# Patient Record
Sex: Female | Born: 1945 | Race: White | Hispanic: No | Marital: Single | Smoking: Never smoker
Health system: Southern US, Community
[De-identification: ages and names within clinical notes are randomized; demographics above are authoritative.]

## PROBLEM LIST (undated history)

## (undated) DIAGNOSIS — Z9109 Other allergy status, other than to drugs and biological substances: Secondary | ICD-10-CM

## (undated) HISTORY — PX: APPENDECTOMY: SHX54

---

## 2017-03-05 NOTE — Discharge Instructions (Signed)

## 2017-03-09 ENCOUNTER — Encounter
Admission: RE | Disposition: A | Discharge status: Home / Self Care | Payer: Self-pay | Source: Ambulatory Visit | Attending: Ophthalmology

## 2017-03-09 ENCOUNTER — Ambulatory Visit: Payer: Medicare Other | Admitting: Anesthesiology

## 2017-03-09 ENCOUNTER — Ambulatory Visit
Admission: RE | Admit: 2017-03-09 | Discharge: 2017-03-09 | Disposition: A | Discharge status: Home / Self Care | Payer: Medicare Other | Source: Ambulatory Visit | Attending: Ophthalmology | Admitting: Ophthalmology

## 2017-03-09 DIAGNOSIS — Z791 Long term (current) use of non-steroidal anti-inflammatories (NSAID): Secondary | ICD-10-CM | POA: Insufficient documentation

## 2017-03-09 DIAGNOSIS — Z79899 Other long term (current) drug therapy: Secondary | ICD-10-CM | POA: Insufficient documentation

## 2017-03-09 DIAGNOSIS — H2511 Age-related nuclear cataract, right eye: Secondary | ICD-10-CM | POA: Insufficient documentation

## 2017-03-09 HISTORY — DX: Other allergy status, other than to drugs and biological substances: Z91.09

## 2017-03-09 HISTORY — PX: CATARACT EXTRACTION W/PHACO: SHX586

## 2017-03-09 SURGERY — PHACOEMULSIFICATION, CATARACT, WITH IOL INSERTION
Anesthesia: Monitor Anesthesia Care | Laterality: Right | Wound class: Clean

## 2017-03-09 MED ORDER — MOXIFLOXACIN HCL 0.5 % OP SOLN
OPHTHALMIC | Status: DC | PRN
  Administered 2017-03-09: 0.2 mL via OPHTHALMIC

## 2017-03-09 MED ORDER — OXYCODONE HCL 5 MG/5ML PO SOLN: 5.0000 mg | Freq: Once | ORAL | Status: DC | PRN

## 2017-03-09 MED ORDER — LIDOCAINE HCL (PF) 2 % IJ SOLN
INTRAOCULAR | Status: DC | PRN
  Administered 2017-03-09: 1 mL via INTRAOCULAR

## 2017-03-09 MED ORDER — FENTANYL CITRATE (PF) 100 MCG/2ML IJ SOLN
INTRAMUSCULAR | Status: DC | PRN
  Administered 2017-03-09 (×2): 50 ug via INTRAVENOUS

## 2017-03-09 MED ORDER — ARMC OPHTHALMIC DILATING DROPS
1.0000 "application " | OPHTHALMIC | Status: DC | PRN
  Administered 2017-03-09 (×3): 1 via OPHTHALMIC

## 2017-03-09 MED ORDER — EPINEPHRINE PF 1 MG/ML IJ SOLN
INTRAMUSCULAR | Status: DC | PRN
  Administered 2017-03-09: 84 mL via OPHTHALMIC

## 2017-03-09 MED ORDER — MIDAZOLAM HCL 2 MG/2ML IJ SOLN
INTRAMUSCULAR | Status: DC | PRN
  Administered 2017-03-09: 2 mg via INTRAVENOUS

## 2017-03-09 MED ORDER — SODIUM HYALURONATE 10 MG/ML IO SOLN
INTRAOCULAR | Status: DC | PRN
  Administered 2017-03-09: 0.55 mL via INTRAOCULAR

## 2017-03-09 MED ORDER — OXYCODONE HCL 5 MG PO TABS: 5.0000 mg | ORAL_TABLET | Freq: Once | ORAL | Status: DC | PRN

## 2017-03-09 MED ORDER — SODIUM HYALURONATE 23 MG/ML IO SOLN
INTRAOCULAR | Status: DC | PRN
  Administered 2017-03-09: 0.6 mL via INTRAOCULAR

## 2017-03-09 MED ORDER — LACTATED RINGERS IV SOLN: INTRAVENOUS | Status: DC

## 2017-03-09 SURGICAL SUPPLY — 16 items
CANNULA ANT/CHMB 27GA (MISCELLANEOUS) ×3 IMPLANT
DISSECTOR HYDRO NUCLEUS 50X22 (MISCELLANEOUS) ×3 IMPLANT
GLOVE BIO SURGEON STRL SZ8 (GLOVE) ×3 IMPLANT
GLOVE SURG LX 7.5 STRW (GLOVE) ×2
GLOVE SURG LX STRL 7.5 STRW (GLOVE) ×1 IMPLANT
GOWN STRL REUS W/ TWL LRG LVL3 (GOWN DISPOSABLE) ×2 IMPLANT
GOWN STRL REUS W/TWL LRG LVL3 (GOWN DISPOSABLE) ×4
LENS IOL TECNIS ITEC 8.5 (Intraocular Lens) ×3 IMPLANT
MARKER SKIN DUAL TIP RULER LAB (MISCELLANEOUS) ×3 IMPLANT
PACK CATARACT (MISCELLANEOUS) ×3 IMPLANT
PACK DR. KING ARMS (PACKS) ×3 IMPLANT
PACK EYE AFTER SURG (MISCELLANEOUS) ×3 IMPLANT
SYR 3ML LL SCALE MARK (SYRINGE) ×3 IMPLANT
SYR TB 1ML LUER SLIP (SYRINGE) ×3 IMPLANT
WATER STERILE IRR 500ML POUR (IV SOLUTION) ×3 IMPLANT
WIPE NON LINTING 3.25X3.25 (MISCELLANEOUS) ×3 IMPLANT

## 2017-03-09 NOTE — Anesthesia Preprocedure Evaluation (Signed)
Anesthesia Evaluation  Patient identified by MRN, date of birth, ID band Patient awake    Airway Mallampati: II  TM Distance: >3 FB Neck ROM: full    Dental no notable dental hx.    Pulmonary neg pulmonary ROS,    Pulmonary exam normal        Cardiovascular negative cardio ROS Normal cardiovascular exam     Neuro/Psych negative neurological ROS  negative psych ROS   GI/Hepatic negative GI ROS, Neg liver ROS,   Endo/Other  negative endocrine ROS  Renal/GU negative Renal ROS  negative genitourinary   Musculoskeletal   Abdominal   Peds  Hematology negative hematology ROS (+)   Anesthesia Other Findings   Reproductive/Obstetrics                             Anesthesia Physical Anesthesia Plan  ASA: II  Anesthesia Plan: MAC   Post-op Pain Management:    Induction:   PONV Risk Score and Plan:   Airway Management Planned:   Additional Equipment:   Intra-op Plan:   Post-operative Plan:   Informed Consent: I have reviewed the patients History and Physical, chart, labs and discussed the procedure including the risks, benefits and alternatives for the proposed anesthesia with the patient or authorized representative who has indicated his/her understanding and acceptance.     Plan Discussed with:   Anesthesia Plan Comments:         Anesthesia Quick Evaluation

## 2017-03-09 NOTE — H&P (Signed)
The History and Physical notes are on paper, have been signed, and are to be scanned.   I have examined the patient and there are no changes to the H&P.   Willey BladeBradley King 03/09/2017 9:35 AM

## 2017-03-09 NOTE — Op Note (Signed)
OPERATIVE NOTE  Nicole QualiaBarbara Jane Murillo [id number] 03/09/2017   PREOPERATIVE DIAGNOSIS:  Nuclear sclerotic cataract right eye.  H25.11   POSTOPERATIVE DIAGNOSIS:    Nuclear sclerotic cataract right eye.     PROCEDURE:  Phacoemusification with posterior chamber intraocular lens placement of the right eye   LENS:   Implant Name Type Inv. Item Serial No. Manufacturer Lot No. LRB No. Used  LENS IOL DIOP 08.5 - [id number] Intraocular Lens LENS IOL DIOP 08.5 [id number]   Right 1       PCB00 +8.5   ULTRASOUND TIME: 0 minutes 34 seconds.  CDE 2.92   SURGEON:  Willey BladeBradley King, MD, MPH  ANESTHESIOLOGIST: Anesthesiologist: Jolayne Pantherunkle, Richard, MD CRNA: Maryan RuedWilson, Jennifer M, CRNA   ANESTHESIA:  Topical with tetracaine drops augmented with 1% preservative-free intracameral lidocaine.  ESTIMATED BLOOD LOSS: less than 1 mL.   COMPLICATIONS:  None.   DESCRIPTION OF PROCEDURE:  The patient was identified in the holding room and transported to the operating room and placed in the supine position under the operating microscope.  The right eye was identified as the operative eye and it was prepped and draped in the usual sterile ophthalmic fashion.   A 1.0 millimeter clear-corneal paracentesis was made at the 10:30 position. 0.5 ml of preservative-free 1% lidocaine with epinephrine was injected into the anterior chamber.  The anterior chamber was filled with Healon 5 viscoelastic.  A 2.4 millimeter keratome was used to make a near-clear corneal incision at the 8:00 position.  A curvilinear capsulorrhexis was made with a cystotome and capsulorrhexis forceps.  Balanced salt solution was used to hydrodissect and hydrodelineate the nucleus.   Phacoemulsification was then used in stop and chop fashion to remove the lens nucleus and epinucleus.  The remaining cortex was then removed using the irrigation and aspiration handpiece. Healon was then placed into the capsular bag to distend it for lens placement.  A lens  was then injected into the capsular bag.  The remaining viscoelastic was aspirated.   Wounds were hydrated with balanced salt solution.  The anterior chamber was inflated to a physiologic pressure with balanced salt solution.   Intracameral vigamox 0.1 mL undiluted was injected into the eye and a drop placed onto the ocular surface.  No wound leaks were noted.  The patient was taken to the recovery room in stable condition without complications of anesthesia or surgery  Willey BladeBradley King 03/09/2017, 10:07 AM

## 2017-03-09 NOTE — Anesthesia Postprocedure Evaluation (Signed)
Anesthesia Post Note  Patient: Nicole Murillo  Procedure(s) Performed: Procedure(s) (LRB): CATARACT EXTRACTION PHACO AND INTRAOCULAR LENS PLACEMENT (IOC)  Right (Right)  Patient location during evaluation: PACU Anesthesia Type: MAC Level of consciousness: awake and alert Pain management: pain level controlled Vital Signs Assessment: post-procedure vital signs reviewed and stable Respiratory status: spontaneous breathing Cardiovascular status: blood pressure returned to baseline Postop Assessment: no headache Anesthetic complications: no    Jaci Standard, III,  Aleiyah Halpin D

## 2017-03-09 NOTE — Transfer of Care (Signed)
Immediate Anesthesia Transfer of Care Note  Patient: Nicole Murillo  Procedure(s) Performed: Procedure(s): CATARACT EXTRACTION PHACO AND INTRAOCULAR LENS PLACEMENT (IOC)  Right (Right)  Patient Location: PACU  Anesthesia Type: MAC  Level of Consciousness: awake, alert  and patient cooperative  Airway and Oxygen Therapy: Patient Spontanous Breathing and Patient connected to supplemental oxygen  Post-op Assessment: Post-op Vital signs reviewed, Patient's Cardiovascular Status Stable, Respiratory Function Stable, Patent Airway and No signs of Nausea or vomiting  Post-op Vital Signs: Reviewed and stable  Complications: No apparent anesthesia complications

## 2017-03-09 NOTE — Anesthesia Procedure Notes (Signed)
Procedure Name: MAC Date/Time: 03/09/2017 9:44 AM Performed by: Janna Arch Pre-anesthesia Checklist: Patient identified, Emergency Drugs available, Suction available and Patient being monitored Patient Re-evaluated:Patient Re-evaluated prior to induction Oxygen Delivery Method: Nasal cannula

## 2017-03-10 ENCOUNTER — Encounter: Payer: Self-pay | Admitting: Ophthalmology

## 2017-03-18 ENCOUNTER — Encounter: Payer: Self-pay | Admitting: *Deleted

## 2017-03-29 NOTE — Discharge Instructions (Signed)

## 2017-03-30 ENCOUNTER — Ambulatory Visit
Admission: RE | Admit: 2017-03-30 | Discharge: 2017-03-30 | Disposition: A | Discharge status: Home / Self Care | Payer: Medicare Other | Source: Ambulatory Visit | Attending: Ophthalmology | Admitting: Ophthalmology

## 2017-03-30 ENCOUNTER — Encounter
Admission: RE | Disposition: A | Discharge status: Home / Self Care | Payer: Self-pay | Source: Ambulatory Visit | Attending: Ophthalmology

## 2017-03-30 ENCOUNTER — Ambulatory Visit: Payer: Medicare Other | Admitting: Anesthesiology

## 2017-03-30 DIAGNOSIS — H2512 Age-related nuclear cataract, left eye: Secondary | ICD-10-CM | POA: Diagnosis present

## 2017-03-30 DIAGNOSIS — H9190 Unspecified hearing loss, unspecified ear: Secondary | ICD-10-CM | POA: Insufficient documentation

## 2017-03-30 HISTORY — PX: CATARACT EXTRACTION W/PHACO: SHX586

## 2017-03-30 SURGERY — PHACOEMULSIFICATION, CATARACT, WITH IOL INSERTION
Anesthesia: Monitor Anesthesia Care | Laterality: Left | Wound class: Clean

## 2017-03-30 MED ORDER — ARMC OPHTHALMIC DILATING DROPS
1.0000 "application " | OPHTHALMIC | Status: DC | PRN
  Administered 2017-03-30 (×3): 1 via OPHTHALMIC

## 2017-03-30 MED ORDER — SODIUM HYALURONATE 23 MG/ML IO SOLN
INTRAOCULAR | Status: DC | PRN
Start: 2017-03-30 — End: 2017-03-30
  Administered 2017-03-30: 0.6 mL via INTRAOCULAR

## 2017-03-30 MED ORDER — SODIUM HYALURONATE 10 MG/ML IO SOLN
INTRAOCULAR | Status: DC | PRN
  Administered 2017-03-30: 0.55 mL via INTRAOCULAR

## 2017-03-30 MED ORDER — FENTANYL CITRATE (PF) 100 MCG/2ML IJ SOLN
INTRAMUSCULAR | Status: DC | PRN
  Administered 2017-03-30: 50 ug via INTRAVENOUS

## 2017-03-30 MED ORDER — ACETAMINOPHEN 160 MG/5ML PO SOLN: 325.0000 mg | ORAL | Status: DC | PRN

## 2017-03-30 MED ORDER — ONDANSETRON HCL 4 MG/2ML IJ SOLN: 4.0000 mg | Freq: Once | INTRAMUSCULAR | Status: DC | PRN

## 2017-03-30 MED ORDER — MIDAZOLAM HCL 2 MG/2ML IJ SOLN
INTRAMUSCULAR | Status: DC | PRN
  Administered 2017-03-30: 2 mg via INTRAVENOUS

## 2017-03-30 MED ORDER — ACETAMINOPHEN 325 MG PO TABS
325.0000 mg | ORAL_TABLET | ORAL | Status: DC | PRN
Start: 2017-03-30 — End: 2017-03-30

## 2017-03-30 MED ORDER — BALANCED SALT IO SOLN
INTRAOCULAR | Status: DC | PRN
  Administered 2017-03-30: 1 mL via INTRAOCULAR

## 2017-03-30 MED ORDER — MOXIFLOXACIN HCL 0.5 % OP SOLN
OPHTHALMIC | Status: DC | PRN
  Administered 2017-03-30: 0.2 mL via OPHTHALMIC

## 2017-03-30 MED ORDER — LACTATED RINGERS IV SOLN: 10.0000 mL/h | INTRAVENOUS | Status: DC

## 2017-03-30 MED ORDER — EPINEPHRINE PF 1 MG/ML IJ SOLN
INTRAMUSCULAR | Status: DC | PRN
  Administered 2017-03-30: 93 mL via OPHTHALMIC

## 2017-03-30 SURGICAL SUPPLY — 16 items
CANNULA ANT/CHMB 27GA (MISCELLANEOUS) ×3 IMPLANT
DISSECTOR HYDRO NUCLEUS 50X22 (MISCELLANEOUS) ×3 IMPLANT
GLOVE BIO SURGEON STRL SZ8 (GLOVE) ×3 IMPLANT
GLOVE SURG LX 7.5 STRW (GLOVE) ×2
GLOVE SURG LX STRL 7.5 STRW (GLOVE) ×1 IMPLANT
GOWN STRL REUS W/ TWL LRG LVL3 (GOWN DISPOSABLE) ×2 IMPLANT
GOWN STRL REUS W/TWL LRG LVL3 (GOWN DISPOSABLE) ×4
LENS IOL TECNIS ITEC 13.0 (Intraocular Lens) ×3 IMPLANT
MARKER SKIN DUAL TIP RULER LAB (MISCELLANEOUS) ×3 IMPLANT
PACK CATARACT (MISCELLANEOUS) ×3 IMPLANT
PACK DR. KING ARMS (PACKS) ×3 IMPLANT
PACK EYE AFTER SURG (MISCELLANEOUS) ×3 IMPLANT
SYR 3ML LL SCALE MARK (SYRINGE) ×3 IMPLANT
SYR TB 1ML LUER SLIP (SYRINGE) ×3 IMPLANT
WATER STERILE IRR 500ML POUR (IV SOLUTION) ×3 IMPLANT
WIPE NON LINTING 3.25X3.25 (MISCELLANEOUS) ×3 IMPLANT

## 2017-03-30 NOTE — Anesthesia Procedure Notes (Signed)
Procedure Name: MAC Performed by: Derenda Giddings Pre-anesthesia Checklist: Patient identified, Emergency Drugs available, Suction available, Timeout performed and Patient being monitored Patient Re-evaluated:Patient Re-evaluated prior to inductionOxygen Delivery Method: Nasal cannula Placement Confirmation: positive ETCO2     

## 2017-03-30 NOTE — Anesthesia Postprocedure Evaluation (Signed)
Anesthesia Post Note  Patient: Nicole Murillo  Procedure(s) Performed: Procedure(s) (LRB): CATARACT EXTRACTION PHACO AND INTRAOCULAR LENS PLACEMENT (IOC) Left (Left)  Patient location during evaluation: PACU Anesthesia Type: MAC Level of consciousness: awake Pain management: pain level controlled Vital Signs Assessment: post-procedure vital signs reviewed and stable Respiratory status: spontaneous breathing, nonlabored ventilation and respiratory function stable Cardiovascular status: stable Postop Assessment: no signs of nausea or vomiting Anesthetic complications: no    Veda Canning

## 2017-03-30 NOTE — H&P (Signed)
The History and Physical notes are on paper, have been signed, and are to be scanned.   I have examined the patient and there are no changes to the H&P.   Nicole Murillo 03/30/2017 10:38 AM

## 2017-03-30 NOTE — Op Note (Signed)
OPERATIVE NOTE  Nicole QualiaBarbara Jane Murillo [id number] 03/30/2017   PREOPERATIVE DIAGNOSIS:  Nuclear sclerotic cataract left eye.  H25.12   POSTOPERATIVE DIAGNOSIS:    Nuclear sclerotic cataract left eye.     PROCEDURE:  Phacoemusification with posterior chamber intraocular lens placement of the left eye   LENS:   Implant Name Type Inv. Item Serial No. Manufacturer Lot No. LRB No. Used  LENS IOL DIOP 13.0 - [id number] Intraocular Lens LENS IOL DIOP 13.0 [id number] AMO   Left 1       PCB00 +13.0   ULTRASOUND TIME: 0 minutes 38 seconds.  CDE 5.80   SURGEON:  Willey BladeBradley Charlsie Fleeger, MD, MPH   ANESTHESIA:  Topical with tetracaine drops augmented with 1% preservative-free intracameral lidocaine.  ESTIMATED BLOOD LOSS: <1 mL   COMPLICATIONS:  None.   DESCRIPTION OF PROCEDURE:  The patient was identified in the holding room and transported to the operating room and placed in the supine position under the operating microscope.  The left eye was identified as the operative eye and it was prepped and draped in the usual sterile ophthalmic fashion.   A 1.0 millimeter clear-corneal paracentesis was made at the 5:00 position. 0.5 ml of preservative-free 1% lidocaine with epinephrine was injected into the anterior chamber.  The anterior chamber was filled with Healon 5 viscoelastic.  A 2.4 millimeter keratome was used to make a near-clear corneal incision at the 2:00 position.  A curvilinear capsulorrhexis was made with a cystotome and capsulorrhexis forceps.  Balanced salt solution was used to hydrodissect and hydrodelineate the nucleus.   Phacoemulsification was then used in stop and chop fashion to remove the lens nucleus and epinucleus.  The remaining cortex was then removed using the irrigation and aspiration handpiece. Healon was then placed into the capsular bag to distend it for lens placement.  A lens was then injected into the capsular bag.  The remaining viscoelastic was aspirated.   Wounds were  hydrated with balanced salt solution.  The anterior chamber was inflated to a physiologic pressure with balanced salt solution.  Intracameral vigamox 0.1 mL undiltued was injected into the eye and a drop placed onto the ocular surface.  No wound leaks were noted.  The patient was taken to the recovery room in stable condition without complications of anesthesia or surgery  Willey BladeBradley Lakasha Mcfall 03/30/2017, 11:20 AM

## 2017-03-30 NOTE — Transfer of Care (Signed)
Immediate Anesthesia Transfer of Care Note  Patient: Nicole Murillo  Procedure(s) Performed: Procedure(s): CATARACT EXTRACTION PHACO AND INTRAOCULAR LENS PLACEMENT (IOC) Left (Left)  Patient Location: PACU  Anesthesia Type: MAC  Level of Consciousness: awake, alert  and patient cooperative  Airway and Oxygen Therapy: Patient Spontanous Breathing and Patient connected to supplemental oxygen  Post-op Assessment: Post-op Vital signs reviewed, Patient's Cardiovascular Status Stable, Respiratory Function Stable, Patent Airway and No signs of Nausea or vomiting  Post-op Vital Signs: Reviewed and stable  Complications: No apparent anesthesia complications

## 2017-03-30 NOTE — Anesthesia Preprocedure Evaluation (Addendum)
Anesthesia Evaluation  Patient identified by MRN, date of birth, ID band Patient awake    Airway Mallampati: II  TM Distance: >3 FB Neck ROM: full    Dental no notable dental hx.    Pulmonary neg pulmonary ROS,    Pulmonary exam normal        Cardiovascular negative cardio ROS Normal cardiovascular exam     Neuro/Psych negative neurological ROS  negative psych ROS   GI/Hepatic negative GI ROS, Neg liver ROS,   Endo/Other  BMI 35  Renal/GU negative Renal ROS  negative genitourinary   Musculoskeletal   Abdominal   Peds  Hematology negative hematology ROS (+)   Anesthesia Other Findings   Reproductive/Obstetrics                            Anesthesia Physical  Anesthesia Plan  ASA: II  Anesthesia Plan: MAC   Post-op Pain Management:    Induction:   PONV Risk Score and Plan:   Airway Management Planned:   Additional Equipment:   Intra-op Plan:   Post-operative Plan:   Informed Consent: I have reviewed the patients History and Physical, chart, labs and discussed the procedure including the risks, benefits and alternatives for the proposed anesthesia with the patient or authorized representative who has indicated his/her understanding and acceptance.     Plan Discussed with:   Anesthesia Plan Comments:         Anesthesia Quick Evaluation

## 2017-03-31 ENCOUNTER — Encounter: Payer: Self-pay | Admitting: Ophthalmology

## 2019-01-06 ENCOUNTER — Emergency Department: Payer: Medicare Other

## 2019-01-06 ENCOUNTER — Inpatient Hospital Stay: Payer: Medicare Other | Admitting: Anesthesiology

## 2019-01-06 ENCOUNTER — Other Ambulatory Visit: Payer: Self-pay

## 2019-01-06 ENCOUNTER — Inpatient Hospital Stay
Admission: EM | Admit: 2019-01-06 | Discharge: 2019-01-09 | DRG: 482 | Disposition: A | Discharge status: Skilled Nursing Facility | Payer: Medicare Other | Attending: Internal Medicine | Admitting: Internal Medicine

## 2019-01-06 ENCOUNTER — Encounter
Admission: EM | Disposition: A | Discharge status: Skilled Nursing Facility | Payer: Self-pay | Source: Home / Self Care | Attending: Internal Medicine

## 2019-01-06 ENCOUNTER — Encounter: Payer: Self-pay | Admitting: Emergency Medicine

## 2019-01-06 ENCOUNTER — Inpatient Hospital Stay: Payer: Medicare Other

## 2019-01-06 DIAGNOSIS — S72012A Unspecified intracapsular fracture of left femur, initial encounter for closed fracture: Principal | ICD-10-CM | POA: Diagnosis present

## 2019-01-06 DIAGNOSIS — Z419 Encounter for procedure for purposes other than remedying health state, unspecified: Secondary | ICD-10-CM

## 2019-01-06 DIAGNOSIS — W010XXA Fall on same level from slipping, tripping and stumbling without subsequent striking against object, initial encounter: Secondary | ICD-10-CM | POA: Diagnosis present

## 2019-01-06 DIAGNOSIS — Z20828 Contact with and (suspected) exposure to other viral communicable diseases: Secondary | ICD-10-CM | POA: Diagnosis present

## 2019-01-06 DIAGNOSIS — Y92241 Library as the place of occurrence of the external cause: Secondary | ICD-10-CM | POA: Diagnosis not present

## 2019-01-06 DIAGNOSIS — Z66 Do not resuscitate: Secondary | ICD-10-CM | POA: Diagnosis present

## 2019-01-06 DIAGNOSIS — Z823 Family history of stroke: Secondary | ICD-10-CM

## 2019-01-06 DIAGNOSIS — W19XXXA Unspecified fall, initial encounter: Secondary | ICD-10-CM

## 2019-01-06 DIAGNOSIS — S72009A Fracture of unspecified part of neck of unspecified femur, initial encounter for closed fracture: Secondary | ICD-10-CM | POA: Diagnosis present

## 2019-01-06 DIAGNOSIS — S72002A Fracture of unspecified part of neck of left femur, initial encounter for closed fracture: Secondary | ICD-10-CM | POA: Diagnosis present

## 2019-01-06 HISTORY — PX: HIP PINNING,CANNULATED: SHX1758

## 2019-01-06 LAB — BASIC METABOLIC PANEL
Anion gap: 8 (ref 5–15)
BUN: 16 mg/dL (ref 8–23)
CO2: 23 mmol/L (ref 22–32)
Calcium: 9 mg/dL (ref 8.9–10.3)
Chloride: 109 mmol/L (ref 98–111)
Creatinine, Ser: 0.96 mg/dL (ref 0.44–1.00)
GFR calc Af Amer: >60 mL/min (ref >60)
GFR calc non Af Amer: 59 mL/min — ABNORMAL LOW (ref >60)
Glucose, Bld: 124 mg/dL — ABNORMAL HIGH (ref 70–99)
Potassium: 3.7 mmol/L (ref 3.5–5.1)
Sodium: 140 mmol/L (ref 135–145)

## 2019-01-06 LAB — CBC
HCT: 41.3 % (ref 36.0–46.0)
Hemoglobin: 14.2 g/dL (ref 12.0–15.0)
MCH: 30.5 pg (ref 26.0–34.0)
MCHC: 34.4 g/dL (ref 30.0–36.0)
MCV: 88.8 fL (ref 80.0–100.0)
Platelets: 189 10*3/uL (ref 150–400)
RBC: 4.65 MIL/uL (ref 3.87–5.11)
RDW: 11.8 % (ref 11.5–15.5)
WBC: 8.9 10*3/uL (ref 4.0–10.5)
nRBC: 0 % (ref 0.0–0.2)

## 2019-01-06 LAB — TYPE AND SCREEN
ABO/RH(D): A POS
Antibody Screen: NEGATIVE

## 2019-01-06 LAB — PROTIME-INR
INR: 1 (ref 0.8–1.2)
Prothrombin Time: 13.4 seconds (ref 11.4–15.2)

## 2019-01-06 LAB — APTT: aPTT: 28 seconds (ref 24–36)

## 2019-01-06 LAB — SARS CORONAVIRUS 2 BY RT PCR (HOSPITAL ORDER, PERFORMED IN ~~LOC~~ HOSPITAL LAB): SARS Coronavirus 2: NEGATIVE

## 2019-01-06 SURGERY — FIXATION, FEMUR, NECK, PERCUTANEOUS, USING SCREW
Anesthesia: Spinal | Laterality: Left

## 2019-01-06 MED ORDER — BUPIVACAINE-EPINEPHRINE (PF) 0.25% -1:200000 IJ SOLN
INTRAMUSCULAR | Status: AC
  Filled 2019-01-06: qty 30

## 2019-01-06 MED ORDER — SODIUM CHLORIDE 0.9 % IV SOLN
INTRAVENOUS | Status: DC | PRN
  Administered 2019-01-06: 13:00:00 via INTRAVENOUS

## 2019-01-06 MED ORDER — ENOXAPARIN SODIUM 30 MG/0.3ML ~~LOC~~ SOLN
30.0000 mg | SUBCUTANEOUS | Status: DC
  Administered 2019-01-07: 08:00:00 30 mg via SUBCUTANEOUS
  Filled 2019-01-06: qty 0.3

## 2019-01-06 MED ORDER — MIDAZOLAM HCL 2 MG/2ML IJ SOLN
INTRAMUSCULAR | Status: AC
  Filled 2019-01-06: qty 2

## 2019-01-06 MED ORDER — CHLORHEXIDINE GLUCONATE 4 % EX LIQD: 1.0000 "application " | Freq: Once | CUTANEOUS | Status: DC

## 2019-01-06 MED ORDER — FLEET ENEMA 7-19 GM/118ML RE ENEM: 1.0000 | ENEMA | Freq: Once | RECTAL | Status: DC | PRN

## 2019-01-06 MED ORDER — SODIUM CHLORIDE 0.45 % IV SOLN
INTRAVENOUS | Status: DC
  Administered 2019-01-06 – 2019-01-07 (×2): via INTRAVENOUS

## 2019-01-06 MED ORDER — FENTANYL CITRATE (PF) 100 MCG/2ML IJ SOLN
INTRAMUSCULAR | Status: AC
  Filled 2019-01-06: qty 2

## 2019-01-06 MED ORDER — ONDANSETRON HCL 4 MG PO TABS: 4.0000 mg | ORAL_TABLET | Freq: Four times a day (QID) | ORAL | Status: DC | PRN

## 2019-01-06 MED ORDER — OXYCODONE HCL 5 MG PO TABS
5.0000 mg | ORAL_TABLET | Freq: Once | ORAL | Status: AC
  Administered 2019-01-06: 5 mg via ORAL
  Filled 2019-01-06: qty 1

## 2019-01-06 MED ORDER — CEFAZOLIN SODIUM-DEXTROSE 1-4 GM/50ML-% IV SOLN
1.0000 g | INTRAVENOUS | Status: AC
  Administered 2019-01-06: 14:00:00 1 g via INTRAVENOUS

## 2019-01-06 MED ORDER — MENTHOL 3 MG MT LOZG
1.0000 | LOZENGE | OROMUCOSAL | Status: DC | PRN
  Filled 2019-01-06: qty 9

## 2019-01-06 MED ORDER — KETAMINE HCL 10 MG/ML IJ SOLN: INTRAMUSCULAR | Status: DC | PRN

## 2019-01-06 MED ORDER — SENNOSIDES-DOCUSATE SODIUM 8.6-50 MG PO TABS: 1.0000 | ORAL_TABLET | Freq: Every evening | ORAL | Status: DC | PRN

## 2019-01-06 MED ORDER — SODIUM CHLORIDE 0.9 % IV SOLN: INTRAVENOUS | Status: DC

## 2019-01-06 MED ORDER — HYDROCODONE-ACETAMINOPHEN 5-325 MG PO TABS: 1.0000 | ORAL_TABLET | Freq: Four times a day (QID) | ORAL | Status: DC | PRN

## 2019-01-06 MED ORDER — KETAMINE HCL 50 MG/ML IJ SOLN
INTRAMUSCULAR | Status: DC | PRN
  Administered 2019-01-06: 20 mg via INTRAMUSCULAR

## 2019-01-06 MED ORDER — MORPHINE SULFATE (PF) 2 MG/ML IV SOLN: 1.0000 mg | INTRAVENOUS | Status: DC | PRN

## 2019-01-06 MED ORDER — ALUM & MAG HYDROXIDE-SIMETH 200-200-20 MG/5ML PO SUSP: 30.0000 mL | ORAL | Status: DC | PRN

## 2019-01-06 MED ORDER — BISACODYL 10 MG RE SUPP
10.0000 mg | Freq: Every day | RECTAL | Status: DC | PRN
  Administered 2019-01-09: 10 mg via RECTAL
  Filled 2019-01-06 (×2): qty 1

## 2019-01-06 MED ORDER — SODIUM CHLORIDE 0.9 % IV SOLN
INTRAVENOUS | Status: DC | PRN
  Administered 2019-01-06 (×2): 30 ug/min via INTRAVENOUS

## 2019-01-06 MED ORDER — METOCLOPRAMIDE HCL 5 MG/ML IJ SOLN: 5.0000 mg | Freq: Three times a day (TID) | INTRAMUSCULAR | Status: DC | PRN

## 2019-01-06 MED ORDER — LIDOCAINE HCL (PF) 2 % IJ SOLN
INTRAMUSCULAR | Status: AC
  Filled 2019-01-06: qty 10

## 2019-01-06 MED ORDER — CEFAZOLIN SODIUM-DEXTROSE 2-4 GM/100ML-% IV SOLN
2.0000 g | Freq: Three times a day (TID) | INTRAVENOUS | Status: AC
  Administered 2019-01-06 – 2019-01-07 (×2): 2 g via INTRAVENOUS
  Filled 2019-01-06 (×4): qty 100

## 2019-01-06 MED ORDER — MAGNESIUM HYDROXIDE 400 MG/5ML PO SUSP
30.0000 mL | Freq: Every day | ORAL | Status: DC | PRN
  Filled 2019-01-06: qty 30

## 2019-01-06 MED ORDER — PHENOL 1.4 % MT LIQD
1.0000 | OROMUCOSAL | Status: DC | PRN
  Filled 2019-01-06: qty 177

## 2019-01-06 MED ORDER — ACETAMINOPHEN 325 MG PO TABS: 650.0000 mg | ORAL_TABLET | Freq: Four times a day (QID) | ORAL | Status: DC | PRN

## 2019-01-06 MED ORDER — CLINDAMYCIN PHOSPHATE 600 MG/50ML IV SOLN
600.0000 mg | INTRAVENOUS | Status: AC
  Administered 2019-01-06: 14:00:00 600 mg via INTRAVENOUS

## 2019-01-06 MED ORDER — KETAMINE HCL 50 MG/ML IJ SOLN
INTRAMUSCULAR | Status: AC
  Filled 2019-01-06: qty 10

## 2019-01-06 MED ORDER — TRAMADOL HCL 50 MG PO TABS
50.0000 mg | ORAL_TABLET | Freq: Four times a day (QID) | ORAL | Status: DC
  Administered 2019-01-07 – 2019-01-09 (×10): 50 mg via ORAL
  Filled 2019-01-06 (×10): qty 1

## 2019-01-06 MED ORDER — PROPOFOL 500 MG/50ML IV EMUL
INTRAVENOUS | Status: DC | PRN
  Administered 2019-01-06: 30 ug/kg/min via INTRAVENOUS

## 2019-01-06 MED ORDER — FENTANYL CITRATE (PF) 100 MCG/2ML IJ SOLN
INTRAMUSCULAR | Status: DC | PRN
  Administered 2019-01-06 (×2): 25 ug via INTRAVENOUS

## 2019-01-06 MED ORDER — ALBUTEROL SULFATE (2.5 MG/3ML) 0.083% IN NEBU: 2.5000 mg | INHALATION_SOLUTION | RESPIRATORY_TRACT | Status: DC | PRN

## 2019-01-06 MED ORDER — CALCIUM CARBONATE 1250 (500 CA) MG PO TABS
1.0000 | ORAL_TABLET | Freq: Every day | ORAL | Status: DC
  Administered 2019-01-09: 08:00:00 500 mg via ORAL
  Filled 2019-01-06 (×3): qty 1

## 2019-01-06 MED ORDER — FENTANYL CITRATE (PF) 100 MCG/2ML IJ SOLN: 25.0000 ug | INTRAMUSCULAR | Status: DC | PRN

## 2019-01-06 MED ORDER — ADULT MULTIVITAMIN W/MINERALS CH
1.0000 | ORAL_TABLET | Freq: Every day | ORAL | Status: DC
  Administered 2019-01-07 – 2019-01-09 (×3): 1 via ORAL
  Filled 2019-01-06 (×2): qty 1

## 2019-01-06 MED ORDER — ONE-DAILY MULTI VITAMINS PO TABS: 1.0000 | ORAL_TABLET | Freq: Every day | ORAL | Status: DC

## 2019-01-06 MED ORDER — ZOLPIDEM TARTRATE 5 MG PO TABS: 5.0000 mg | ORAL_TABLET | Freq: Every evening | ORAL | Status: DC | PRN

## 2019-01-06 MED ORDER — BUPIVACAINE-EPINEPHRINE 0.25% -1:200000 IJ SOLN
INTRAMUSCULAR | Status: DC | PRN
  Administered 2019-01-06: 30 mL

## 2019-01-06 MED ORDER — ACETAMINOPHEN 650 MG RE SUPP: 650.0000 mg | Freq: Four times a day (QID) | RECTAL | Status: DC | PRN

## 2019-01-06 MED ORDER — ESMOLOL HCL 100 MG/10ML IV SOLN
INTRAVENOUS | Status: DC | PRN
  Administered 2019-01-06: 10 mg via INTRAVENOUS

## 2019-01-06 MED ORDER — ONDANSETRON HCL 4 MG/2ML IJ SOLN: 4.0000 mg | Freq: Four times a day (QID) | INTRAMUSCULAR | Status: DC | PRN

## 2019-01-06 MED ORDER — METOCLOPRAMIDE HCL 5 MG PO TABS: 5.0000 mg | ORAL_TABLET | Freq: Three times a day (TID) | ORAL | Status: DC | PRN

## 2019-01-06 MED ORDER — ONDANSETRON HCL 4 MG/2ML IJ SOLN
4.0000 mg | Freq: Four times a day (QID) | INTRAMUSCULAR | Status: DC | PRN
  Administered 2019-01-06: 4 mg via INTRAVENOUS
  Filled 2019-01-06: qty 2

## 2019-01-06 MED ORDER — SODIUM CHLORIDE 0.9 % IV SOLN
Freq: Once | INTRAVENOUS | Status: AC
  Administered 2019-01-06: 09:00:00 via INTRAVENOUS

## 2019-01-06 MED ORDER — KETOROLAC TROMETHAMINE 15 MG/ML IJ SOLN
15.0000 mg | Freq: Four times a day (QID) | INTRAMUSCULAR | Status: DC | PRN
  Filled 2019-01-06: qty 1

## 2019-01-06 MED ORDER — ACETAMINOPHEN 325 MG PO TABS: 325.0000 mg | ORAL_TABLET | Freq: Four times a day (QID) | ORAL | Status: DC | PRN

## 2019-01-06 MED ORDER — BISACODYL 5 MG PO TBEC
5.0000 mg | DELAYED_RELEASE_TABLET | Freq: Every day | ORAL | Status: DC | PRN
  Administered 2019-01-09: 06:00:00 5 mg via ORAL
  Filled 2019-01-06: qty 1

## 2019-01-06 MED ORDER — CALCIUM CARBONATE 1250 (500 CA) MG PO TABS: 1.0000 | ORAL_TABLET | Freq: Every day | ORAL | Status: DC

## 2019-01-06 MED ORDER — LIDOCAINE HCL (CARDIAC) PF 100 MG/5ML IV SOSY
PREFILLED_SYRINGE | INTRAVENOUS | Status: DC | PRN
  Administered 2019-01-06: 90 mg via INTRAVENOUS

## 2019-01-06 MED ORDER — HYDROCODONE-ACETAMINOPHEN 5-325 MG PO TABS: 1.0000 | ORAL_TABLET | ORAL | Status: DC | PRN

## 2019-01-06 MED ORDER — SENNA 8.6 MG PO TABS
1.0000 | ORAL_TABLET | Freq: Two times a day (BID) | ORAL | Status: DC
  Administered 2019-01-06 – 2019-01-09 (×6): 8.6 mg via ORAL
  Filled 2019-01-06 (×6): qty 1

## 2019-01-06 MED ORDER — HYDROCODONE-ACETAMINOPHEN 7.5-325 MG PO TABS: 1.0000 | ORAL_TABLET | Freq: Four times a day (QID) | ORAL | Status: DC | PRN

## 2019-01-06 MED ORDER — ACETAMINOPHEN 500 MG PO TABS
1000.0000 mg | ORAL_TABLET | Freq: Once | ORAL | Status: AC
  Administered 2019-01-06: 1000 mg via ORAL
  Filled 2019-01-06: qty 2

## 2019-01-06 MED ORDER — PROPOFOL 500 MG/50ML IV EMUL
INTRAVENOUS | Status: AC
  Filled 2019-01-06: qty 50

## 2019-01-06 MED ORDER — EPHEDRINE SULFATE 50 MG/ML IJ SOLN
INTRAMUSCULAR | Status: DC | PRN
  Administered 2019-01-06 (×2): 10 mg via INTRAVENOUS

## 2019-01-06 MED ORDER — CLINDAMYCIN PHOSPHATE 600 MG/50ML IV SOLN
600.0000 mg | Freq: Three times a day (TID) | INTRAVENOUS | Status: AC
  Administered 2019-01-06 – 2019-01-07 (×2): 600 mg via INTRAVENOUS
  Filled 2019-01-06 (×4): qty 50

## 2019-01-06 MED ORDER — FERROUS SULFATE 325 (65 FE) MG PO TABS
325.0000 mg | ORAL_TABLET | Freq: Every day | ORAL | Status: DC
  Administered 2019-01-07 – 2019-01-09 (×3): 325 mg via ORAL
  Filled 2019-01-06 (×3): qty 1

## 2019-01-06 SURGICAL SUPPLY — 28 items
BIT DRILL CANN 7.3MM (BIT) IMPLANT
BLADE SURG SZ11 CARB STEEL (BLADE) ×3 IMPLANT
BNDG COHESIVE 4X5 TAN STRL (GAUZE/BANDAGES/DRESSINGS) ×3 IMPLANT
CHLORAPREP W/TINT 26 (MISCELLANEOUS) ×3 IMPLANT
COVER WAND RF STERILE (DRAPES) ×3 IMPLANT
DRILL BIT CANN 7.3MM (BIT) ×3
DRSG AQUACEL AG ADV 3.5X10 (GAUZE/BANDAGES/DRESSINGS) ×3 IMPLANT
GAUZE SPONGE 4X4 12PLY STRL (GAUZE/BANDAGES/DRESSINGS) ×3 IMPLANT
GLOVE BIO SURGEON STRL SZ8 (GLOVE) ×3 IMPLANT
GLOVE SURG ORTHO 8.5 STRL (GLOVE) ×3 IMPLANT
GLOVE SURG XRAY 8.5 LX (GLOVE) ×1 IMPLANT
GOWN STRL REUS W/ TWL LRG LVL3 (GOWN DISPOSABLE) ×1 IMPLANT
GOWN STRL REUS W/TWL LRG LVL3 (GOWN DISPOSABLE) ×2
GOWN STRL REUS W/TWL LRG LVL4 (GOWN DISPOSABLE) ×3 IMPLANT
GUIDEWIRE THREADED 2.8MM (WIRE) ×8 IMPLANT
KIT TURNOVER KIT A (KITS) ×3 IMPLANT
MAT ABSORB  FLUID 56X50 GRAY (MISCELLANEOUS) ×2
MAT ABSORB FLUID 56X50 GRAY (MISCELLANEOUS) ×1 IMPLANT
NDL SPNL 18GX3.5 QUINCKE PK (NEEDLE) ×1 IMPLANT
NEEDLE SPNL 18GX3.5 QUINCKE PK (NEEDLE) ×3 IMPLANT
NS IRRIG 500ML POUR BTL (IV SOLUTION) ×3 IMPLANT
PACK HIP COMPR (MISCELLANEOUS) ×3 IMPLANT
SCREW CANN 32 THRD/75 7.3 (Screw) ×2 IMPLANT
SCREW CANN 32 THRD/80 7.3 (Screw) ×4 IMPLANT
SCREW CANN THREADED 7.3X85 (Screw) ×2 IMPLANT
STRAP SAFETY 5IN WIDE (MISCELLANEOUS) ×3 IMPLANT
SUT ETHILON 3 0 FSLX (SUTURE) ×3 IMPLANT
SYR 30ML LL (SYRINGE) ×3 IMPLANT

## 2019-01-06 NOTE — ED Triage Notes (Signed)
Pt here for mechanical fall yesterday. C/o pain to left posterior back. No lumbar pain. No shortening or rotation

## 2019-01-06 NOTE — Consult Note (Signed)
ORTHOPAEDIC CONSULTATION  REQUESTING PHYSICIAN: Shaune Pollackhen, Qing, MD    Chief Complaint: Left hip pain  HPI: Nicole Murillo is a 73 y.o. female who complains of left hip pain after falling outside the library yesterday afternoon.  She had pain but got home last night.  Her pain was worse this morning and she came to the emergency room.  She lives alone.  She does not have a regular doctor.  Exam and x-rays showed an impacted subcapital fracture of the left hip.  She is tested negative for corona 19 virus.  I have recommended surgical stabilization of the fracture.  Pinning versus hemiarthroplasty were discussed.  I feel that a pinning is satisfactory treatment at this time due to the nondisplaced nature of the fracture.  She is agreeable to this and understands she will have to minimize weightbearing for several weeks.  The risk of avascular necrosis and further surgery were discussed with her as well.  She has been cleared for surgery by the medical service.   Past Medical History:  Diagnosis Date  . Environmental allergies    Past Surgical History:  Procedure Laterality Date  . APPENDECTOMY    . CATARACT EXTRACTION W/PHACO Right 03/09/2017   Procedure: CATARACT EXTRACTION PHACO AND INTRAOCULAR LENS PLACEMENT (IOC)  Right;  Surgeon: Nevada CraneKing, Bradley Mark, MD;  [facility];  Service: Ophthalmology;  Laterality: Right;  . CATARACT EXTRACTION W/PHACO Left 03/30/2017   Procedure: CATARACT EXTRACTION PHACO AND INTRAOCULAR LENS PLACEMENT (IOC) Left;  Surgeon: Nevada CraneKing, Bradley Mark, MD;  [facility];  Service: Ophthalmology;  Laterality: Left;   Social History   Socioeconomic History  . Marital status: Single    Spouse name: Not on file  . Number of children: Not on file  . Years of education: Not on file  . Highest education level: Not on file  Occupational History  . Not on file  Social Needs  . Financial resource strain: Not on file  . Food insecurity:   Worry: Not on file    Inability: Not on file  . Transportation needs:    Medical: Not on file    Non-medical: Not on file  Tobacco Use  . Smoking status: Never Smoker  . Smokeless tobacco: Never Used  Substance and Sexual Activity  . Alcohol use: Never    Frequency: Never  . Drug use: Never  . Sexual activity: Not on file  Lifestyle  . Physical activity:    Days per week: Not on file    Minutes per session: Not on file  . Stress: Not on file  Relationships  . Social connections:    Talks on phone: Not on file    Gets together: Not on file    Attends religious service: Not on file    Active member of club or organization: Not on file    Attends meetings of clubs or organizations: Not on file    Relationship status: Not on file  Other Topics Concern  . Not on file  Social History Narrative  . Not on file   Family History  Problem Relation Age of Onset  . Addison's disease Mother   . Stroke Mother    No Known Allergies Prior to Admission medications   Medication Sig Start Date End Date Taking? Authorizing Provider  calcium carbonate (OS-CAL - DOSED IN MG OF ELEMENTAL CALCIUM) 1250 (500 Ca) MG tablet Take 1 tablet by mouth daily with breakfast.   Yes [provider]  ibuprofen (  ADVIL,MOTRIN) 200 MG tablet Take 200 mg by mouth every 6 (six) hours as needed.   Yes [provider]  Multiple Vitamin (MULTIVITAMIN) tablet Take 1 tablet by mouth daily.   Yes [provider]   Dg Chest 1 View  Result Date: 01/06/2019 CLINICAL DATA:  Left hip fracture. EXAM: CHEST  1 VIEW COMPARISON:  None. FINDINGS: The heart size and mediastinal contours are within normal limits. Normal pulmonary vascularity. No focal consolidation, pleural effusion, or pneumothorax. Elevation of the right hemidiaphragm. No acute osseous abnormality. IMPRESSION: No active disease. Electronically Signed   By: Obie Dredge M.D.   On: 01/06/2019 11:26   Dg Hip Unilat W Or Wo Pelvis 2-3  Views Left  Result Date: 01/06/2019 CLINICAL DATA:  Larey Seat yesterday, pain wt bearing left hip since then. No hx of heart/lung disease of previous injury EXAM: DG HIP (WITH OR WITHOUT PELVIS) 2-3V LEFT COMPARISON:  None. FINDINGS: Impacted subcapital left femoral neck fracture. No dislocation. Bony pelvis intact. Mild diffuse subjective osteopenia. IMPRESSION: Impacted subcapital left femoral neck fracture. Electronically Signed   By: Corlis Leak M.D.   On: 01/06/2019 09:05    Positive ROS: All other systems have been reviewed and were otherwise negative with the exception of those mentioned in the HPI and as above.  Physical Exam: General: Alert, no acute distress Cardiovascular: No pedal edema Respiratory: No cyanosis, no use of accessory musculature GI: No organomegaly, abdomen is soft and non-tender Skin: No lesions in the area of chief complaint Neurologic: Sensation intact distally Psychiatric: Patient is competent for consent with normal mood and affect Lymphatic: No axillary or cervical lymphadenopathy  MUSCULOSKELETAL: The patient is alert and fully oriented.  The left leg is slightly rotated.  It is not shortened.  There is pain with movement of the hip.  Neurovascular status is good.  There is mild abrasion on the right forearm.  Assessment: Impacted left subcapital hip fracture  Plan: Left hip pinning.    Valinda Hoar, MD [phone]   01/06/2019 12:57 PM

## 2019-01-06 NOTE — Anesthesia Procedure Notes (Addendum)
Spinal  Patient location during procedure: OR Start time: 01/06/2019 1:25 PM End time: 01/06/2019 1:42 PM Staffing Anesthesiologist: Jovita Gamma, MD Performed: anesthesiologist  Preanesthetic Checklist Completed: patient identified, site marked, surgical consent, pre-op evaluation, timeout performed, IV checked, risks and benefits discussed and monitors and equipment checked Spinal Block Patient position: sitting Prep: ChloraPrep Patient monitoring: heart rate, continuous pulse ox, blood pressure and cardiac monitor Approach: midline Location: L3-4 Injection technique: single-shot Needle Needle type: Whitacre and Introducer  Needle gauge: 24 G Needle length: 9 cm Additional Notes Negative paresthesia. Positive free-flowing clear CSF. Patient tolerated procedure well, without complications.

## 2019-01-06 NOTE — H&P (Signed)
THE PATIENT WAS SEEN PRIOR TO SURGERY TODAY.  HISTORY, ALLERGIES, HOME MEDICATIONS AND OPERATIVE PROCEDURE WERE REVIEWED. RISKS AND BENEFITS OF SURGERY DISCUSSED WITH PATIENT AGAIN.  NO CHANGES FROM INITIAL HISTORY AND PHYSICAL NOTED.    

## 2019-01-06 NOTE — ED Provider Notes (Signed)
[facility] Emergency Department Provider Note  ____________________________________________  Time seen: Approximately 8:19 AM  I have reviewed the triage vital signs and the nursing notes.   HISTORY  Chief Complaint Fall   HPI Nicole Murillo is a 74 y.o. female who presents for evaluation of left hip pain.  Patient reports she went to the library yesterday to get a book.  On her way to the checkout desk patient tripped and fell onto her left hip.  She was able to ambulate and went home.  She took Tylenol and went to sleep.  This morning when she woke up she continued to have pain only with weightbearing and movement of the leg which prompted her visit to the emergency room.  She reports no pain at rest but moderate dull ache on the posterior aspect of her proximal femur.  No back pain, no neck pain, no other extremity pain.  Past Medical History:  Diagnosis Date  . Environmental allergies     There are no active problems to display for this patient.   Past Surgical History:  Procedure Laterality Date  . APPENDECTOMY    . CATARACT EXTRACTION W/PHACO Right 03/09/2017   Procedure: CATARACT EXTRACTION PHACO AND INTRAOCULAR LENS PLACEMENT (IOC)  Right;  Surgeon: Nevada Crane, MD;  Location: [facility];  Service: Ophthalmology;  Laterality: Right;  . CATARACT EXTRACTION W/PHACO Left 03/30/2017   Procedure: CATARACT EXTRACTION PHACO AND INTRAOCULAR LENS PLACEMENT (IOC) Left;  Surgeon: Nevada Crane, MD;  Location: [facility];  Service: Ophthalmology;  Laterality: Left;    Prior to Admission medications   Medication Sig Start Date End Date Taking? Authorizing Provider  calcium carbonate (OS-CAL - DOSED IN MG OF ELEMENTAL CALCIUM) 1250 (500 Ca) MG tablet Take 1 tablet by mouth daily with breakfast.   Yes [provider]  ibuprofen (ADVIL,MOTRIN) 200 MG tablet Take 200 mg by mouth every 6 (six) hours as needed.   Yes  [provider]  Multiple Vitamin (MULTIVITAMIN) tablet Take 1 tablet by mouth daily.   Yes [provider]    Allergies Patient has no known allergies.  History reviewed. No pertinent family history.  Social History Social History   Tobacco Use  . Smoking status: Never Smoker  . Smokeless tobacco: Never Used  Substance Use Topics  . Alcohol use: Never    Frequency: Never  . Drug use: Never    Review of Systems  Constitutional: Negative for fever. Eyes: Negative for visual changes. ENT: Negative for sore throat. Neck: No neck pain  Cardiovascular: Negative for chest pain. Respiratory: Negative for shortness of breath. Gastrointestinal: Negative for abdominal pain, vomiting or diarrhea. Genitourinary: Negative for dysuria. Musculoskeletal: Negative for back pain. + L hip pain Skin: Negative for rash. Neurological: Negative for headaches, weakness or numbness. Psych: No SI or HI  ____________________________________________   PHYSICAL EXAM:  VITAL SIGNS: Vitals:   01/06/19 0821  BP: (!) 165/98  Pulse: (!) 101  Resp: (!) 21  Temp: 98.5 F (36.9 C)  SpO2: 98%   Full spinal precautions maintained throughout the trauma exam. Constitutional: Alert and oriented. No acute distress. Does not appear intoxicated. HEENT Head: Normocephalic and atraumatic. Face: No facial bony tenderness. Stable midface Ears: No hemotympanum bilaterally. No Battle sign Eyes: No eye injury. PERRL. No raccoon eyes Nose: Nontender. No epistaxis. No rhinorrhea Mouth/Throat: Mucous membranes are moist. No oropharyngeal blood. No dental injury. Airway patent without stridor. Normal voice. Neck: no C-collar in  place. No midline c-spine tenderness.  Cardiovascular: Normal rate, regular rhythm. Normal and symmetric distal pulses are present in all extremities. Pulmonary/Chest: Chest wall is stable and nontender to palpation/compression. Normal respiratory effort. Breath sounds  are normal. No crepitus.  Abdominal: Soft, nontender, non distended. Musculoskeletal: Tender to palpation over the posterior aspect of the proximal femur on the left with pain with ROM. nontender with normal full range of motion in all other extremities. No deformities. No thoracic or lumbar midline spinal tenderness. Pelvis is stable. Skin: Skin is warm, dry and intact. No abrasions or contutions. Psychiatric: Speech and behavior are appropriate. Neurological: Normal speech and language. Moves all extremities to command. No gross focal neurologic deficits are appreciated.  Glascow Coma Score: 4 - Opens eyes on own 6 - Follows simple motor commands 5 - Alert and oriented GCS: 15   ____________________________________________   LABS (all labs ordered are listed, but only abnormal results are displayed)  Labs Reviewed  BASIC METABOLIC PANEL - Abnormal; Notable for the following components:      Result Value   Glucose, Bld 124 (*)    GFR calc non Af Amer 59 (*)    All other components within normal limits  SARS CORONAVIRUS 2 (HOSPITAL ORDER, PERFORMED IN [facility])  CBC  PROTIME-INR  APTT  TYPE AND SCREEN   ____________________________________________  EKG  ED ECG REPORT I, Nita Sickle, the attending physician, personally viewed and interpreted this ECG.  Normal sinus rhythm, rate of 92, occasional PVCs, normal axis, normal intervals, no ST elevations or depressions ____________________________________________  RADIOLOGY  I have personally reviewed the images performed during this visit and I agree with the Radiologist's read.   Interpretation by Radiologist:  Dg Hip Unilat W Or Wo Pelvis 2-3 Views Left  Result Date: 01/06/2019 CLINICAL DATA:  Larey Seat yesterday, pain wt bearing left hip since then. No hx of heart/lung disease of previous injury EXAM: DG HIP (WITH OR WITHOUT PELVIS) 2-3V LEFT COMPARISON:  None. FINDINGS: Impacted subcapital left femoral  neck fracture. No dislocation. Bony pelvis intact. Mild diffuse subjective osteopenia. IMPRESSION: Impacted subcapital left femoral neck fracture. Electronically Signed   By: Corlis Leak M.D.   On: 01/06/2019 09:05      ____________________________________________   PROCEDURES  Procedure(s) performed: None Procedures Critical Care performed:  None ____________________________________________   INITIAL IMPRESSION / ASSESSMENT AND PLAN / ED COURSE   73 y.o. female who presents for evaluation of left hip pain status post mechanical fall yesterday.  Patient has been ambulatory and pain is only present with weightbearing.  She is tender to palpation over the posterior proximal left femur area and has pain with range of motion of the left hip.  X-rays have been ordered.    _________________________ 9:12 AM on 01/06/2019 -----------------------------------------  X-ray confirms femoral neck fracture.  Discussed with Dr. Hyacinth Meeker from orthopedics recommend admission to the hospitalist for surgery this afternoon.  Patient will remain n.p.o. Labs for medical clearance WNL   As part of my medical decision making, I reviewed the following data within the electronic MEDICAL RECORD NUMBER Nursing notes reviewed and incorporated, Labs reviewed , EKG interpreted , Old chart reviewed, Radiograph reviewed , Discussed with admitting physician , A consult was requested and obtained from this/these consultant(s) Orthopedics, Notes from prior ED visits and [place] Controlled Substance Database    Pertinent labs & imaging results that were available during my care of the patient were reviewed by me and considered in my medical decision  making (see chart for details).    ____________________________________________   FINAL CLINICAL IMPRESSION(S) / ED DIAGNOSES  Final diagnoses:  Hip fracture (HCC)  Fall, initial encounter      NEW MEDICATIONS STARTED DURING THIS VISIT:  ED Discharge Orders    None        Note:  This document was prepared using Dragon voice recognition software and may include unintentional dictation errors.    Don PerkingVeronese, [place], MD 01/06/19 1021

## 2019-01-06 NOTE — Anesthesia Procedure Notes (Signed)
Procedure Name: MAC Date/Time: 01/06/2019 1:30 PM Performed by: Allean Found, CRNA Pre-anesthesia Checklist: Patient identified, Emergency Drugs available, Suction available, Patient being monitored and Timeout performed Patient Re-evaluated:Patient Re-evaluated prior to induction Oxygen Delivery Method: Nasal cannula Placement Confirmation: positive ETCO2

## 2019-01-06 NOTE — Anesthesia Post-op Follow-up Note (Signed)
Anesthesia QCDR form completed.        

## 2019-01-06 NOTE — ED Notes (Signed)
Report to OR RN.

## 2019-01-06 NOTE — Transfer of Care (Signed)
Immediate Anesthesia Transfer of Care Note  Patient: Nicole Murillo  Procedure(s) Performed: CANNULATED HIP PINNING (Left )  Patient Location: PACU  Anesthesia Type:Spinal  Level of Consciousness: awake, alert  and oriented  Airway & Oxygen Therapy: Patient Spontanous Breathing and Patient connected to nasal cannula oxygen  Post-op Assessment: Report given to RN and Post -op Vital signs reviewed and stable  Post vital signs: Reviewed and stable  Last Vitals:  Vitals Value Taken Time  BP 89/56 01/06/2019  3:05 PM  Temp 36.3 C 01/06/2019  3:05 PM  Pulse 85 01/06/2019  3:12 PM  Resp 23 01/06/2019  3:12 PM  SpO2 100 % 01/06/2019  3:12 PM  Vitals shown include unvalidated device data.  Last Pain:  Vitals:   01/06/19 1505  TempSrc:   PainSc: 0-No pain         Complications: No apparent anesthesia complications

## 2019-01-06 NOTE — Anesthesia Preprocedure Evaluation (Addendum)
Anesthesia Evaluation  Patient identified by MRN, date of birth, ID band Patient awake    Reviewed: Allergy & Precautions, H&P , NPO status , Patient's Chart, lab work & pertinent test results  Airway Mallampati: III  TM Distance: >3 FB Neck ROM: full   Comment: High arched palate, prominent teeth Dental  (+) Poor Dentition   Pulmonary neg pulmonary ROS, neg COPD,           Cardiovascular Exercise Tolerance: Good (-) Past MI and (-) Cardiac Stents negative cardio ROS  (-) dysrhythmias      Neuro/Psych negative neurological ROS  negative psych ROS   GI/Hepatic negative GI ROS, Neg liver ROS,   Endo/Other  negative endocrine ROS  Renal/GU   negative genitourinary   Musculoskeletal   Abdominal   Peds  Hematology negative hematology ROS (+)   Anesthesia Other Findings Past Medical History: No date: Environmental allergies  Past Surgical History: No date: APPENDECTOMY 03/09/2017: CATARACT EXTRACTION W/PHACO; Right     Comment:  Procedure: CATARACT EXTRACTION PHACO AND INTRAOCULAR               LENS PLACEMENT (IOC)  Right;  Surgeon: Nevada Crane, MD;  Location: [facility];  Service:               Ophthalmology;  Laterality: Right; 03/30/2017: CATARACT EXTRACTION W/PHACO; Left     Comment:  Procedure: CATARACT EXTRACTION PHACO AND INTRAOCULAR               LENS PLACEMENT (IOC) Left;  Surgeon: Nevada Crane,               MD;  Location: [facility];  Service:               Ophthalmology;  Laterality: Left;  BMI    Body Mass Index:  35.36 kg/m      Reproductive/Obstetrics negative OB ROS                            Anesthesia Physical Anesthesia Plan  ASA: II  Anesthesia Plan: Spinal   Post-op Pain Management:    Induction:   PONV Risk Score and Plan:   Airway Management Planned:   Additional Equipment:   Intra-op Plan:    Post-operative Plan:   Informed Consent: I have reviewed the patients History and Physical, chart, labs and discussed the procedure including the risks, benefits and alternatives for the proposed anesthesia with the patient or authorized representative who has indicated his/her understanding and acceptance.     Dental Advisory Given  Plan Discussed with: Anesthesiologist and CRNA  Anesthesia Plan Comments:         Anesthesia Quick Evaluation

## 2019-01-06 NOTE — H&P (Signed)
[facility] [place] [facility]   PATIENT NAME: Nicole Murillo    MR#:  [id number]  DATE OF BIRTH:  18-Apr-1946  DATE OF ADMISSION:  01/06/2019  PRIMARY CARE PHYSICIAN: Patient, No Pcp Per   REQUESTING/REFERRING PHYSICIAN: Nita SickleVeronese, [place], MD  CHIEF COMPLAINT:   Chief Complaint  Patient presents with  . Fall   Fall by accident today. HISTORY OF PRESENT ILLNESS:  Nicole Murillo  is a 73 y.o. female with no medical history.  She presents the ED with above chief complaint.  She fell by accident today and had left hip pain.  X-ray ED report left femoral fracture.  COVID-19 test is negative.  Per ED physician, Dr. Hyacinth MeekerMiller will do surgery this afternoon.  Patient denies any syncope or seizure, she denies any other symptoms except left hip pain.  She denies any other injury. PAST MEDICAL HISTORY:   Past Medical History:  Diagnosis Date  . Environmental allergies     PAST SURGICAL HISTORY:   Past Surgical History:  Procedure Laterality Date  . APPENDECTOMY    . CATARACT EXTRACTION W/PHACO Right 03/09/2017   Procedure: CATARACT EXTRACTION PHACO AND INTRAOCULAR LENS PLACEMENT (IOC)  Right;  Surgeon: Nevada CraneKing, Bradley Mark, MD;  [facility];  Service: Ophthalmology;  Laterality: Right;  . CATARACT EXTRACTION W/PHACO Left 03/30/2017   Procedure: CATARACT EXTRACTION PHACO AND INTRAOCULAR LENS PLACEMENT (IOC) Left;  Surgeon: Nevada CraneKing, Bradley Mark, MD;  [facility];  Service: Ophthalmology;  Laterality: Left;    SOCIAL HISTORY:   Social History   Tobacco Use  . Smoking status: Never Smoker  . Smokeless tobacco: Never Used  Substance Use Topics  . Alcohol use: Never    Frequency: Never    FAMILY HISTORY:   Family History  Problem Relation Age of Onset  . Addison's disease Mother   . Stroke Mother     DRUG ALLERGIES:  No Known Allergies  REVIEW OF SYSTEMS:   Review of Systems  Constitutional: Negative for chills, fever  and malaise/fatigue.  HENT: Negative for sore throat.   Eyes: Negative for blurred vision and double vision.  Respiratory: Negative for cough, hemoptysis, shortness of breath, wheezing and stridor.   Cardiovascular: Negative for chest pain, palpitations, orthopnea and leg swelling.  Gastrointestinal: Negative for abdominal pain, blood in stool, diarrhea, melena, nausea and vomiting.  Genitourinary: Negative for dysuria, flank pain and hematuria.  Musculoskeletal: Positive for joint pain. Negative for back pain.  Skin: Negative for rash.  Neurological: Negative for dizziness, sensory change, focal weakness, seizures, loss of consciousness, weakness and headaches.  Endo/Heme/Allergies: Negative for polydipsia.  Psychiatric/Behavioral: Negative for depression. The patient is nervous/anxious.     MEDICATIONS AT HOME:   Prior to Admission medications   Medication Sig Start Date End Date Taking? Authorizing Provider  calcium carbonate (OS-CAL - DOSED IN MG OF ELEMENTAL CALCIUM) 1250 (500 Ca) MG tablet Take 1 tablet by mouth daily with breakfast.   Yes [provider]  ibuprofen (ADVIL,MOTRIN) 200 MG tablet Take 200 mg by mouth every 6 (six) hours as needed.   Yes [provider]  Multiple Vitamin (MULTIVITAMIN) tablet Take 1 tablet by mouth daily.   Yes [provider]      VITAL SIGNS:  Blood pressure (!) 165/98, pulse (!) 101, temperature 98.5 F (36.9 C), temperature source Oral, resp. rate (!) 21, height 5\' 4"  (1.626 m), weight 93.4 kg, SpO2 98 %.  PHYSICAL EXAMINATION:  Physical Exam  GENERAL:  73 y.o.-year-old patient lying in the bed with no acute distress.  EYES: Pupils equal, round, reactive to light and accommodation. No scleral icterus. Extraocular muscles intact.  HEENT: Head atraumatic, normocephalic. Oropharynx and nasopharynx clear.  NECK:  Supple, no jugular venous distention. No thyroid enlargement, no tenderness.  LUNGS: Normal breath sounds  bilaterally, no wheezing, rales,rhonchi or crepitation. No use of accessory muscles of respiration.  CARDIOVASCULAR: S1, S2 normal. No murmurs, rubs, or gallops.  ABDOMEN: Soft, nontender, nondistended. Bowel sounds present. No organomegaly or mass.  EXTREMITIES: No pedal edema, cyanosis, or clubbing.  NEUROLOGIC: Cranial nerves II through XII are intact. Muscle strength 5/5 in all extremities except left leg. Sensation intact. Gait not checked.  PSYCHIATRIC: The patient is alert and oriented x 3.  SKIN: No obvious rash, lesion, or ulcer.   LABORATORY PANEL:   CBC Recent Labs  Lab 01/06/19 0855  WBC 8.9  HGB 14.2  HCT 41.3  PLT 189   ------------------------------------------------------------------------------------------------------------------  Chemistries  Recent Labs  Lab 01/06/19 0855  NA 140  K 3.7  CL 109  CO2 23  GLUCOSE 124*  BUN 16  CREATININE 0.96  CALCIUM 9.0   ------------------------------------------------------------------------------------------------------------------  Cardiac Enzymes No results for input(s): TROPONINI in the last 168 hours. ------------------------------------------------------------------------------------------------------------------  RADIOLOGY:  Dg Hip Unilat W Or Wo Pelvis 2-3 Views Left  Result Date: 01/06/2019 CLINICAL DATA:  Larey Seat yesterday, pain wt bearing left hip since then. No hx of heart/lung disease of previous injury EXAM: DG HIP (WITH OR WITHOUT PELVIS) 2-3V LEFT COMPARISON:  None. FINDINGS: Impacted subcapital left femoral neck fracture. No dislocation. Bony pelvis intact. Mild diffuse subjective osteopenia. IMPRESSION: Impacted subcapital left femoral neck fracture. Electronically Signed   By: Corlis Leak M.D.   On: 01/06/2019 09:05      IMPRESSION AND PLAN:   Left hip fracture. Admit to medical floor. Low risk for hip surgery. NPO, IVF, pain control. Follow up Dr. Hyacinth Meeker for surgery today,  DVT prophylaxis and  PT after surgery.  All the records are reviewed and case discussed with ED provider. Management plans discussed with the patient, family and they are in agreement.  CODE STATUS: DNR  TOTAL TIME TAKING CARE OF THIS PATIENT: 42 minutes.    Shaune Pollack M.D on 01/06/2019 at 10:39 AM  Between 7am to 6pm - Pager - [phone]  After 6pm go to [email or web address] - Social research officer, government  [facility]  Office  [phone]  CC: Primary care physician; Patient, No Pcp Per   Note: This dictation was prepared with Dragon dictation along with smaller phrase technology. Any transcriptional errors that result from this process are unin

## 2019-01-06 NOTE — Op Note (Signed)
01/06/2019  2:59 PM  PATIENT:  Nicole Murillo    PRE-OPERATIVE DIAGNOSIS:  Left Hip Fracture, impacted subcapital  POST-OPERATIVE DIAGNOSIS:  Same  PROCEDURE:  CANNULATED HIP PINNING LEFT  SURGEON:  Valinda Hoar, MD     ANESTHESIA:   Spinal  PREOPERATIVE INDICATIONS:  Nicole Murillo is a  73 y.o. female who fell and was found to have a diagnosis of Left Hip Fracture who elected for surgical management.    The risks benefits and alternatives were discussed with the patient preoperatively including but not limited to the risks of infection, bleeding, nerve injury, cardiopulmonary complications, blood clots, malunion, nonunion, avascular necrosis, the need for revision surgery, the potential for conversion to hemiarthroplasty, among others, and the patient was willing to proceed.  OPERATIVE IMPLANTS: 7.3 mm cannulated screws x4  OPERATIVE FINDINGS: Clinical osteoporosis with weak bone, proximal femur  OPERATIVE PROCEDURE: The patient was brought to the operating room and placed in supine position. IV antibiotics were given. General anesthesia administered.  The patient was placed on the fracture table. The operative extremity was positioned, without any significant reduction maneuver and was prepped and draped in usual sterile fashion.  Time out was performed.  Small incisions were made distal to the greater trochanter, and 4 guidewires were introduced into the head and neck. The lengths were measured. The reduction was slightly valgus, and near-anatomic. I opened the cortex with a cannulated drill, and then placed the screws into position. Satisfactory fixation was achieved.  The wounds were irrigated copiously, and repaired with  3-O Nylon with and sterile gauze. Sponge and needle count were correct.  There no complications and the patient tolerated the procedure well.  The patient will be touch down weightbearing as tolerated, and will be on Lovenox  for DVT  prophylaxis.   Valinda Hoar, MD

## 2019-01-06 NOTE — ED Notes (Signed)
Patient transported to X-ray 

## 2019-01-06 NOTE — NC FL2 (Signed)
  [place] MEDICAID FL2 LEVEL OF CARE SCREENING TOOL     IDENTIFICATION  Patient Name: Nicole Murillo Birthdate: 09/15/1945 Sex: female Admission Date (Current Location): 01/06/2019  [place] and [place] Number:  Chiropodist and Address:  [facility], [address], [place], [place] [address]      Provider Number: [id number]  Attending Physician Name and Address:  Shaune Pollack, MD  Relative Name and Phone Number:       Current Level of Care: Hospital Recommended Level of Care: Skilled Nursing Facility Prior Approval Number:    Date Approved/Denied:   PASRR Number: [id number]  Discharge Plan: SNF    Current Diagnoses: Patient Active Problem List   Diagnosis Date Noted  . Closed left hip fracture (HCC) 01/06/2019    Orientation RESPIRATION BLADDER Height & Weight     Self, Time, Situation, Place    Continent Weight: 93.4 kg Height:  5\' 4"  (162.6 cm)  BEHAVIORAL SYMPTOMS/MOOD NEUROLOGICAL BOWEL NUTRITION STATUS  (none) (None) Continent    AMBULATORY STATUS COMMUNICATION OF NEEDS Skin   Limited Assist   Surgical wounds(surgical hip repair)                       Personal Care Assistance Level of Assistance  Bathing, Dressing Bathing Assistance: Limited assistance   Dressing Assistance: Limited assistance     Functional Limitations Info  Hearing(Has aids but does not always wear)   Hearing Info: Impaired      SPECIAL CARE FACTORS FREQUENCY  PT (By licensed PT), OT (By licensed OT)     PT Frequency: 5-7 x wk OT Frequency: 5-7 x wk            Contractures Contractures Info: Not present    Additional Factors Info  Code Status Code Status Info: Full Code             Current Medications (01/06/2019):  This is the current hospital active medication list Current Facility-Administered Medications  Medication Dose Route Frequency Provider Last Rate Last Dose  . ceFAZolin (ANCEF) IVPB 1 g/50 mL premix  1 g  Intravenous On Call to OR Deeann Saint, MD      . chlorhexidine (HIBICLENS) 4 % liquid 1 application  1 application Topical Once Deeann Saint, MD      . clindamycin (CLEOCIN) IVPB 600 mg  600 mg Intravenous On Call to OR Deeann Saint, MD      . morphine 2 MG/ML injection 1 mg  1 mg Intravenous Q2H PRN Deeann Saint, MD       Current Outpatient Medications  Medication Sig Dispense Refill  . calcium carbonate (OS-CAL - DOSED IN MG OF ELEMENTAL CALCIUM) 1250 (500 Ca) MG tablet Take 1 tablet by mouth daily with breakfast.    . ibuprofen (ADVIL,MOTRIN) 200 MG tablet Take 200 mg by mouth every 6 (six) hours as needed.    . Multiple Vitamin (MULTIVITAMIN) tablet Take 1 tablet by mouth daily.       Discharge Medications: Please see discharge summary for a list of discharge medications.  Relevant Imaging Results:  Relevant Lab Results:   Additional Information    Eber Hong, RN

## 2019-01-06 NOTE — Progress Notes (Signed)
Advanced Care Plan.  Purpose of Encounter: CODE STATUS. Parties in Attendance: The patient and me. Patient's Decisional Capacity: Yes. Medical Story: Nicole Murillo  is a 73 y.o. female with no medical history.  She is being admitted to the hospital due to left hip fracture.  I discussed with patient about her current condition, prognosis and CODE STATUS.  The patient does not want to be resuscitated and intubated if she has cardiopulmonary arrest. Plan:  Code Status: DNR. Time spent discussing advance care planning: 17 minutes.

## 2019-01-06 NOTE — TOC Initial Note (Addendum)
Transition of Care [facility]) - Initial/Assessment Note    Patient Details  Name: Nicole Murillo MRN: [id number] Date of Birth: [birth year]  Transition of Care [facility]) CM/SW Contact:    Eber Hong, RN Phone Number: 01/06/2019, 11:39 AM  Clinical Narrative:                Patient had mechanical fall 5.21.20 while at [employer]. She drove herself home and became unable to ambulate due to pain in left hip. Positive for fracture. Independent in all adls. She has no previous medical history except s/p cataract extraction. No PCP "I do not like doctors and so far have not needed one."  She has one step up into the condo from her garage.  She makes her own health care decisions. Has a brother who is retired that lives in [place].  She does not know if she would come stay with her for a while at time of discharge.  She would consider skilled nursing placement if "it is really needed."  Hs a walker "with wheels on the front" in her out building.  Does not have a bedside commode.  She does not know if she has medicare part D coverage. [facility] who research and found  she does not  have medicare D coverage for medications . Says she would conisder skilled nursing if it was needed. Obtained pasarr. FL2 completed and sent for signature . Covid is negative today      Barriers to Discharge: No Barriers Identified   Patient Goals and CMS Choice Patient states their goals for this hospitalization and ongoing recovery are:: Hope to go home CMS Medicare.gov Compare Post Acute Care list provided to:: (pending disposition after surgical intervention) Choice offered to / list presented to : NA  Expected Discharge Plan and Services     Discharge Planning Services: CM Consult Post Acute Care Choice: NA Living arrangements for the past 2 months: Apartment(single level condo)                 DME Arranged: N/A         HH Arranged: NA HH Agency: NA        Prior Living  Arrangements/Services Living arrangements for the past 2 months: Apartment(single level condo) Lives with:: Self Patient language and need for interpreter reviewed:: No Do you feel safe going back to the place where you live?: Yes      Need for Family Participation in Patient Care: Yes (Comment) Care giver support system in place?: No (comment) Current home services: (None) Criminal Activity/Legal Involvement Pertinent to Current Situation/Hospitalization: No - Comment as needed  Activities of Daily Living      Permission Sought/Granted Permission sought to share information with : Case Manager, Family Supports Permission granted to share information with : Yes, Verbal Permission Granted  Share Information with NAME: Brother           Emotional Assessment Appearance:: Appears stated age Attitude/Demeanor/Rapport: Gracious Affect (typically observed): Accepting Orientation: : Oriented to Self, Oriented to Place, Oriented to  Time, Oriented to Situation Alcohol / Substance Use: Not Applicable Psych Involvement: No (comment)  Admission diagnosis:  fall Patient Active Problem List   Diagnosis Date Noted  . Closed left hip fracture (HCC) 01/06/2019   PCP:  Patient, No Pcp Per Pharmacy:   [facility] - [place], [place] - [address] [place] [address] Phone: [phone] Fax: [phone]  Social Determinants of Health (SDOH) Interventions    Readmission Risk Interventions No flowsheet data found.

## 2019-01-06 NOTE — ED Notes (Signed)
XRAY tech in the room to take pt to XRAY.

## 2019-01-06 NOTE — ED Notes (Signed)
Updated brother about patient status to OR/floor.

## 2019-01-06 NOTE — ED Notes (Addendum)
Called and updated brother Jonny Ruiz per pt request.

## 2019-01-06 NOTE — TOC Progression Note (Signed)
Transition of Care [facility]) - Progression Note    Patient Details  Name: Eisele Downham MRN: [id number] Date of Birth: 19-Oct-1945  Transition of Care [facility]) CM/SW Contact  Barrie Dunker, RN Phone Number: 01/06/2019, 1:22 PM  Clinical Narrative:     Requested Lovenox price from CM bucket via email to provide the patient with a price before DC    Barriers to Discharge: No Barriers Identified  Expected Discharge Plan and Services     Discharge Planning Services: CM Consult Post Acute Care Choice: NA Living arrangements for the past 2 months: Apartment(single level condo)                 DME Arranged: N/A         HH Arranged: NA HH Agency: NA         Social Determinants of Health (SDOH) Interventions    Readmission Risk Interventions No flowsheet data found.

## 2019-01-06 NOTE — ED Notes (Signed)
ED TO INPATIENT HANDOFF REPORT  ED Nurse Name and Phone #: Vikki Ports [phone]  S Name/Age/Gender Nicole Murillo 73 y.o. female Room/Bed: ED15A/ED15A  Code Status   Code Status: Not on file  Home/SNF/Other Home Patient oriented to: self, place, time and situation Is this baseline? Yes   Triage Complete: Triage complete  Chief Complaint fall  Triage Note Pt here for mechanical fall yesterday. C/o pain to left posterior back. No lumbar pain. No shortening or rotation   Allergies No Known Allergies  Level of Care/Admitting Diagnosis ED Disposition    ED Disposition Condition Comment   Admit  Hospital Area: [facility] [[id number]]  Level of Care: Med-Surg [16]  Covid Evaluation: N/A  Diagnosis: Closed left hip fracture [facility]) [[id number]]  Admitting Physician: Shaune Pollack [[id number]]  Attending Physician: Shaune Pollack [phone]  Estimated length of stay: 3 - 4 days  Certification:: I certify this patient will need inpatient services for at least 2 midnights  PT Class (Do Not Modify): Inpatient [101]  PT Acc Code (Do Not Modify): Private [1]       B Medical/Surgery History Past Medical History:  Diagnosis Date  . Environmental allergies    Past Surgical History:  Procedure Laterality Date  . APPENDECTOMY    . CATARACT EXTRACTION W/PHACO Right 03/09/2017   Procedure: CATARACT EXTRACTION PHACO AND INTRAOCULAR LENS PLACEMENT (IOC)  Right;  Surgeon: Nevada Crane, MD;  Location: [facility];  Service: Ophthalmology;  Laterality: Right;  . CATARACT EXTRACTION W/PHACO Left 03/30/2017   Procedure: CATARACT EXTRACTION PHACO AND INTRAOCULAR LENS PLACEMENT (IOC) Left;  Surgeon: Nevada Crane, MD;  Location: [facility];  Service: Ophthalmology;  Laterality: Left;     A IV Location/Drains/Wounds Patient Lines/Drains/Airways Status   Active Line/Drains/Airways    Name:   Placement date:   Placement time:   Site:   Days:   Peripheral IV  01/06/19 Left Hand   01/06/19    0850    Hand   less than 1   External Urinary Catheter   01/06/19    1002    -   less than 1          Intake/Output Last 24 hours No intake or output data in the 24 hours ending 01/06/19 1158  Labs/Imaging Results for orders placed or performed during the hospital encounter of 01/06/19 (from the past 48 hour(s))  CBC     Status: None   Collection Time: 01/06/19  8:55 AM  Result Value Ref Range   WBC 8.9 4.0 - 10.5 K/uL   RBC 4.65 3.87 - 5.11 MIL/uL   Hemoglobin 14.2 12.0 - 15.0 g/dL   HCT 56.2 13.0 - 86.5 %   MCV 88.8 80.0 - 100.0 fL   MCH 30.5 26.0 - 34.0 pg   MCHC 34.4 30.0 - 36.0 g/dL   RDW 78.4 69.6 - 29.5 %   Platelets 189 150 - 400 K/uL   nRBC 0.0 0.0 - 0.2 %    Comment: Performed at [facility], [address] [place], [place] [address]  Basic metabolic panel     Status: Abnormal   Collection Time: 01/06/19  8:55 AM  Result Value Ref Range   Sodium 140 135 - 145 mmol/L   Potassium 3.7 3.5 - 5.1 mmol/L   Chloride 109 98 - 111 mmol/L   CO2 23 22 - 32 mmol/L   Glucose, Bld 124 (H) 70 - 99 mg/dL   BUN 16 8 -  23 mg/dL   Creatinine, Ser 1.610.96 0.44 - 1.00 mg/dL   Calcium 9.0 8.9 - 09.610.3 mg/dL   GFR calc non Af Amer 59 (L) >60 mL/min   GFR calc Af Amer >60 >60 mL/min   Anion gap 8 5 - 15    Comment: Performed at [facility], [address] [place], [place] [address]  Protime-INR     Status: None   Collection Time: 01/06/19  8:55 AM  Result Value Ref Range   Prothrombin Time 13.4 11.4 - 15.2 seconds   INR 1.0 0.8 - 1.2    Comment: (NOTE) INR goal varies based on device and disease states. Performed at [facility], [address] [place], [place] [address]   APTT     Status: None   Collection Time: 01/06/19  8:55 AM  Result Value Ref Range   aPTT 28 24 - 36 seconds    Comment: Performed at [facility], [address] [place], [place] [address]  SARS Coronavirus 2 (CEPHEID - Performed in  [facility] hospital lab), Hosp Order     Status: None   Collection Time: 01/06/19  9:20 AM  Result Value Ref Range   SARS Coronavirus 2 NEGATIVE NEGATIVE    Comment: (NOTE) If result is NEGATIVE SARS-CoV-2 target nucleic acids are NOT DETECTED. The SARS-CoV-2 RNA is generally detectable in upper and lower  respiratory specimens during the acute phase of infection. The lowest  concentration of SARS-CoV-2 viral copies this assay can detect is 250  copies / mL. A negative result does not preclude SARS-CoV-2 infection  and should not be used as the sole basis for treatment or other  patient management decisions.  A negative result may occur with  improper specimen collection / handling, submission of specimen other  than nasopharyngeal swab, presence of viral mutation(s) within the  areas targeted by this assay, and inadequate number of viral copies  (<250 copies / mL). A negative result must be combined with clinical  observations, patient history, and epidemiological information. If result is POSITIVE SARS-CoV-2 target nucleic acids are DETECTED. The SARS-CoV-2 RNA is generally detectable in upper and lower  respiratory specimens dur ing the acute phase of infection.  Positive  results are indicative of active infection with SARS-CoV-2.  Clinical  correlation with patient history and other diagnostic information is  necessary to determine patient infection status.  Positive results do  not rule out bacterial infection or co-infection with other viruses. If result is PRESUMPTIVE POSTIVE SARS-CoV-2 nucleic acids MAY BE PRESENT.   A presumptive positive result was obtained on the submitted specimen  and confirmed on repeat testing.  While 2019 novel coronavirus  (SARS-CoV-2) nucleic acids may be present in the submitted sample  additional confirmatory testing may be necessary for epidemiological  and / or clinical management purposes  to differentiate between  SARS-CoV-2 and other  Sarbecovirus currently known to infect humans.  If clinically indicated additional testing with an alternate test  methodology [phone]([id number]) is advised. The SARS-CoV-2 RNA is generally  detectable in upper and lower respiratory sp ecimens during the acute  phase of infection. The expected result is Negative. Fact Sheet for Patients:  BoilerBrush.com.cyhttps://www.fda.gov/media/136312/download Fact Sheet for Healthcare Providers: https://pope.com/https://www.fda.gov/media/136313/download This test is not yet approved or cleared by the [place] FDA and has been authorized for detection and/or diagnosis of SARS-CoV-2 by FDA under an Emergency Use Authorization (EUA).  This EUA will remain in effect (meaning this test can be used) for  the duration of the COVID-19 declaration under Section 564(b)(1) of the Act, 21 U.S.C. section 360bbb-3(b)(1), unless the authorization is terminated or revoked sooner. Performed at [facility], [address] [place], [place] [address]   Type and screen     Status: None   Collection Time: 01/06/19  9:20 AM  Result Value Ref Range   ABO/RH(D) A POS    Antibody Screen NEG    Sample Expiration      01/09/2019,2359 Performed at [facility], [address] [place], [place] [address]    Dg Chest 1 View  Result Date: 01/06/2019 CLINICAL DATA:  Left hip fracture. EXAM: CHEST  1 VIEW COMPARISON:  None. FINDINGS: The heart size and mediastinal contours are within normal limits. Normal pulmonary vascularity. No focal consolidation, pleural effusion, or pneumothorax. Elevation of the right hemidiaphragm. No acute osseous abnormality. IMPRESSION: No active disease. Electronically Signed   By: Obie Dredge M.D.   On: 01/06/2019 11:26   Dg Hip Unilat W Or Wo Pelvis 2-3 Views Left  Result Date: 01/06/2019 CLINICAL DATA:  Larey Seat yesterday, pain wt bearing left hip since then. No hx of heart/lung disease of previous injury EXAM: DG HIP (WITH OR WITHOUT PELVIS) 2-3V LEFT  COMPARISON:  None. FINDINGS: Impacted subcapital left femoral neck fracture. No dislocation. Bony pelvis intact. Mild diffuse subjective osteopenia. IMPRESSION: Impacted subcapital left femoral neck fracture. Electronically Signed   By: Corlis Leak M.D.   On: 01/06/2019 09:05    Pending Labs Unresulted Labs (From admission, onward)    Start     Ordered   01/06/19 1033  Urinalysis, Routine w reflex microscopic  ONCE - STAT,   STAT     01/06/19 1033   Signed and Held  Basic metabolic panel  Tomorrow morning,   R     Signed and Held   Signed and Held  CBC  Tomorrow morning,   R     Signed and Held   Signed and Held  Creatinine, serum  (enoxaparin (LOVENOX)    CrCl >/= 30 ml/min)  Weekly,   R    Comments:  while on enoxaparin therapy    Signed and Held          Vitals/Pain Today's Vitals   01/06/19 0904 01/06/19 1000 01/06/19 1030 01/06/19 1112  BP:    136/86  Pulse:  88 90 95  Resp:  17 17 20   Temp:      TempSrc:      SpO2:  98% 96% 93%  Weight:      Height:      PainSc: 0-No pain       Isolation Precautions No active isolations  Medications Medications  ceFAZolin (ANCEF) IVPB 1 g/50 mL premix (has no administration in time range)  chlorhexidine (HIBICLENS) 4 % liquid 1 application (has no administration in time range)  clindamycin (CLEOCIN) IVPB 600 mg (has no administration in time range)  morphine 2 MG/ML injection 1 mg (has no administration in time range)  acetaminophen (TYLENOL) tablet 1,000 mg (1,000 mg Oral Given 01/06/19 0826)  oxyCODONE (Oxy IR/ROXICODONE) immediate release tablet 5 mg (5 mg Oral Given 01/06/19 0826)  0.9 %  sodium chloride infusion ( Intravenous New Bag/Given 01/06/19 0903)    Mobility walks with device Moderate fall risk   Focused Assessments fell left hip pain. no rotation/shortening   R Recommendations: See Admitting Provider Note  Report given to:   Additional Notes:

## 2019-01-07 LAB — BASIC METABOLIC PANEL
Anion gap: 9 (ref 5–15)
BUN: 12 mg/dL (ref 8–23)
CO2: 23 mmol/L (ref 22–32)
Calcium: 8.6 mg/dL — ABNORMAL LOW (ref 8.9–10.3)
Chloride: 106 mmol/L (ref 98–111)
Creatinine, Ser: 0.77 mg/dL (ref 0.44–1.00)
GFR calc Af Amer: >60 mL/min (ref >60)
GFR calc non Af Amer: >60 mL/min (ref >60)
Glucose, Bld: 107 mg/dL — ABNORMAL HIGH (ref 70–99)
Potassium: 3.8 mmol/L (ref 3.5–5.1)
Sodium: 138 mmol/L (ref 135–145)

## 2019-01-07 LAB — CBC
HCT: 37.5 % (ref 36.0–46.0)
Hemoglobin: 12.7 g/dL (ref 12.0–15.0)
MCH: 30.2 pg (ref 26.0–34.0)
MCHC: 33.9 g/dL (ref 30.0–36.0)
MCV: 89.1 fL (ref 80.0–100.0)
Platelets: 173 10*3/uL (ref 150–400)
RBC: 4.21 MIL/uL (ref 3.87–5.11)
RDW: 11.7 % (ref 11.5–15.5)
WBC: 8 10*3/uL (ref 4.0–10.5)
nRBC: 0 % (ref 0.0–0.2)

## 2019-01-07 MED ORDER — ENOXAPARIN SODIUM 40 MG/0.4ML ~~LOC~~ SOLN
40.0000 mg | SUBCUTANEOUS | Status: DC
  Administered 2019-01-08 – 2019-01-09 (×2): 40 mg via SUBCUTANEOUS
  Filled 2019-01-07 (×2): qty 0.4

## 2019-01-07 NOTE — Progress Notes (Signed)
Subjective: 1 Day Post-Op Procedure(s) (LRB): CANNULATED HIP PINNING (Left)    Patient reports pain as moderate.  Out of bed in chair today.  Starting PT.  The goal is to keep her touchdown weightbearing for 6 weeks.  Will probably need rehab.  Objective:   VITALS:   Vitals:   01/07/19 0421 01/07/19 1048  BP: 122/74 130/83  Pulse: 84 96  Resp: 17 16  Temp: 98.1 F (36.7 C) 98.2 F (36.8 C)  SpO2: 97% 91%    Neurologically intact ABD soft Neurovascular intact Sensation intact distally Intact pulses distally Dorsiflexion/Plantar flexion intact Incision: no drainage  LABS Recent Labs    01/06/19 0855 01/07/19 0429  HGB 14.2 12.7  HCT 41.3 37.5  WBC 8.9 8.0  PLT 189 173    Recent Labs    01/06/19 0855 01/07/19 0429  NA 140 138  K 3.7 3.8  BUN 16 12  CREATININE 0.96 0.77  GLUCOSE 124* 107*    Recent Labs    01/06/19 0855  INR 1.0     Assessment/Plan: 1 Day Post-Op Procedure(s) (LRB): CANNULATED HIP PINNING (Left)   Advance diet Up with therapy D/C IV fluids Discharge to SNF

## 2019-01-07 NOTE — Progress Notes (Signed)
PHARMACIST - PHYSICIAN COMMUNICATION  CONCERNING:  Enoxaparin (Lovenox) for DVT Prophylaxis   RECOMMENDATION: Patient was prescribed enoxaprin 40mg  q24 hours for VTE prophylaxis.   Filed Weights   01/06/19 0819  Weight: 206 lb (93.4 kg)    Body mass index is 35.36 kg/m.  Estimated Creatinine Clearance: 69.4 mL/min (by C-G formula based on SCr of 0.77 mg/dL).   Patient is candidate for enoxaparin 40mg  every 24 hours based on CrCl > 69mL/min.  DESCRIPTION: Pharmacy has adjusted enoxaparin dose per [facility] policy.  Patient is now receiving enoxaparin 40mg  every 24 hours with next scheduled dose on 5/24.   Pharmacy will continue to monitor and adjust per protocol.   Clarabel Marion L, RPh 01/07/2019 8:37 AM

## 2019-01-07 NOTE — Plan of Care (Signed)

## 2019-01-07 NOTE — Progress Notes (Addendum)
[facility]   PATIENT NAME: Nicole Murillo    MR#:  [id number]  DATE OF BIRTH:  Jan 28, 1946  SUBJECTIVE:  CHIEF COMPLAINT:   Chief Complaint  Patient presents with  . Fall   Left hip pain. REVIEW OF SYSTEMS:  Review of Systems  Constitutional: Positive for malaise/fatigue. Negative for chills and fever.  HENT: Negative for sore throat.   Eyes: Negative for blurred vision and double vision.  Respiratory: Negative for cough, hemoptysis, shortness of breath, wheezing and stridor.   Cardiovascular: Negative for chest pain, palpitations, orthopnea and leg swelling.  Gastrointestinal: Negative for abdominal pain, blood in stool, diarrhea, melena, nausea and vomiting.  Genitourinary: Negative for dysuria, flank pain and hematuria.  Musculoskeletal: Positive for joint pain. Negative for back pain.  Skin: Negative for rash.  Neurological: Negative for dizziness, sensory change, focal weakness, seizures, loss of consciousness, weakness and headaches.  Endo/Heme/Allergies: Negative for polydipsia.  Psychiatric/Behavioral: Negative for depression. The patient is not nervous/anxious.     DRUG ALLERGIES:  No Known Allergies VITALS:  Blood pressure 130/83, pulse 96, temperature 98.2 F (36.8 C), resp. rate 16, height 5\' 4"  (1.626 m), weight 93.4 kg, SpO2 91 %. PHYSICAL EXAMINATION:  Physical Exam Constitutional:      General: She is not in acute distress.    Appearance: Normal appearance.  HENT:     Head: Normocephalic.     Mouth/Throat:     Mouth: Mucous membranes are moist.  Eyes:     General: No scleral icterus.    Conjunctiva/sclera: Conjunctivae normal.     Pupils: Pupils are equal, round, and reactive to light.  Neck:     Musculoskeletal: Normal range of motion and neck supple.     Vascular: No JVD.     Trachea: No tracheal deviation.  Cardiovascular:     Rate and Rhythm: Normal rate and regular rhythm.     Heart sounds: Normal  heart sounds. No murmur. No gallop.   Pulmonary:     Effort: Pulmonary effort is normal. No respiratory distress.     Breath sounds: Normal breath sounds. No wheezing or rales.  Abdominal:     General: Bowel sounds are normal. There is no distension.     Palpations: Abdomen is soft.     Tenderness: There is no abdominal tenderness. There is no rebound.  Musculoskeletal: Normal range of motion.        General: No tenderness.     Right lower leg: No edema.     Left lower leg: No edema.  Skin:    Findings: No erythema or rash.  Neurological:     General: No focal deficit present.     Mental Status: She is alert and oriented to person, place, and time.     Cranial Nerves: No cranial nerve deficit.  Psychiatric:        Mood and Affect: Mood normal.    LABORATORY PANEL:  Female CBC Recent Labs  Lab 01/07/19 0429  WBC 8.0  HGB 12.7  HCT 37.5  PLT 173   ------------------------------------------------------------------------------------------------------------------ Chemistries  Recent Labs  Lab 01/07/19 0429  NA 138  K 3.8  CL 106  CO2 23  GLUCOSE 107*  BUN 12  CREATININE 0.77  CALCIUM 8.6*   RADIOLOGY:  Dg Hip Operative Unilat W Or W/o Pelvis Left  Result Date: 01/06/2019 CLINICAL DATA:  Percutaneous pinning of the left hip EXAM: OPERATIVE left HIP (WITH PELVIS IF PERFORMED) 12 VIEWS TECHNIQUE:  Fluoroscopic spot image(s) were submitted for interpretation post-operatively. COMPARISON:  01/06/2019 FINDINGS: The patient has undergone percutaneous pinning of the left hip. The total fluoroscopy time was 58 seconds. Final images demonstrate improved alignment of the proximal left femur. IMPRESSION: Status post percutaneous pinning of the left hip. Electronically Signed   By: Katherine Mantlehristopher  Green M.D.   On: 01/06/2019 15:17   ASSESSMENT AND PLAN:   Left hip fracture. S/p left hip surgery.  Pain control. DVT prophylaxis and PT. PT evaluation suggest skilled nursing facility  placement.  The patient prefers to go home with home health and physical therapy.  All the records are reviewed and case discussed with Care Management/Social Worker. Management plans discussed with the patient, her brother and they are in agreement.  CODE STATUS: Full Code  TOTAL TIME TAKING CARE OF THIS PATIENT: 26 minutes.   More than 50% of the time was spent in counseling/coordination of care: YES  POSSIBLE D/C IN 2 DAYS, DEPENDING ON CLINICAL CONDITION.  Shaune PollackQing Carmesha Morocco M.D on 01/07/2019 at 12:40 PM  Between 7am to 6pm - Pager - [phone]  After 6pm go to [email or web address] - Therapist, nutritionalpassword [email or web address]  [facility] [place]

## 2019-01-07 NOTE — Evaluation (Signed)
Physical Therapy Evaluation Patient Details Name: Nicole QualiaBarbara Jane Julian MRN: [id number] DOB: [birth year] Today's Date: 01/07/2019   History of Present Illness  Pt is a 73 y.o. female [facility] [employer] [facility] with L hip pain.  Pt presented to hospital 01/06/19 and found to have impacted subcapital L hip fx.  Pt s/p cannulated L hip pinning 01/06/19.    Clinical Impression  Prior to hospital admission, pt reports being independent; used SPC in L hand for ambulation.  Pt lives alone in 1 level home.  Currently pt is min assist with bed mobility but requires 2 assist with transfers using RW.  Max vc's and physical assist required to assist with maintaining L LE WB'ing precautions. Pt demonstrated difficulty attending to 1 step commands (appears to get distracted easily/decreased attention span noted).  Pt would benefit from skilled PT to address noted impairments and functional limitations (see below for any additional details).  Upon hospital discharge, recommend pt discharge to [facility].    Follow Up Recommendations SNF    Equipment Recommendations  Rolling walker with 5" wheels;3in1 (PT);Wheelchair (measurements PT);Wheelchair cushion (measurements PT)    Recommendations for Other Services OT consult     Precautions / Restrictions Precautions Precautions: Fall Restrictions Weight Bearing Restrictions: Yes LLE Weight Bearing: Non weight bearing Other Position/Activity Restrictions: Multiple WB'ing noted in chart review; per secure text with MD Hyacinth MeekerMiller 01/07/19, "toe touch" WB'ing is the goal      Mobility  Bed Mobility Overal bed mobility: Needs Assistance Bed Mobility: Supine to Sit;Sit to Supine     Supine to sit: Min assist;HOB elevated Sit to supine: Min assist;HOB elevated   General bed mobility comments: assist for L LE; vc's for technique  Transfers Overall transfer level: Needs assistance Equipment used: Rolling walker (2 wheeled) Transfers: Sit to/from  [employer]Stand;Stand Pivot Transfers Sit to Stand: +2 physical assistance Stand pivot transfers: +2 physical assistance       General transfer comment: pt on BSC with NT present upon PT arrival; [facility] with max assist x1 but upon standing pt with posterior lean requiring assist from therapist to [facility]; 2nd attempt [facility] requiring min to mod assist x2 and then performed stand pivot transfer BSC to bed with min to mod assist x2 and RW use; 1st trial standing from bed pt requiring min assist x2 to stand up to RW; stand pivot bed to recliner min to mod assist x2 with RW; max vc's and physical assist required to attempt to maintain L LE WB'ing precautions and for transfer technique (positioning; UE/LE placement)  Ambulation/Gait             General Gait Details: Pt pivoting on R LE with transfers using RW and not able to follow cueing to safely attempt ambulation (d/t concerns of maintaining L LE WB'ing status)  Stairs            Wheelchair Mobility    Modified Rankin (Stroke Patients Only)       Balance Overall balance assessment: Needs assistance Sitting-balance support: No upper extremity supported;Feet supported Sitting balance-Leahy Scale: Good Sitting balance - Comments: steady sitting reaching within BOS   Standing balance support: Bilateral upper extremity supported Standing balance-Leahy Scale: Poor Standing balance comment: posterior lean noted in standing requiring assist to shift weight forward (use of RW)  Pertinent Vitals/Pain Pain Assessment: 0-10 Pain Score: 0-No pain Pain Location: L hip/thigh Pain Intervention(s): Limited activity within patient's tolerance;Monitored during session;Repositioned  O2 sats 92% or greater (on room air) with activity during session.  HR 86 to 103 bpm during session's activities.    Home Living Family/patient expects to be  discharged to:: Private residence Living Arrangements: Alone   Type of Home: Apartment Home Access: Level entry;Other (comment)(level entry from front; 1 step (no railing) from garage)     Home Layout: One level Home Equipment: Walker - 2 wheels;Grab bars - tub/shower;Cane - single point Additional Comments: Pt reports RW is from her mother and is in her "out building" and not sure how she will access it.    Prior Function Level of Independence: Independent with assistive device(s)         Comments: Recently using SPC in L hand.  Pt reports no other falls in past 6 months.     Hand Dominance        Extremity/Trunk Assessment   Upper Extremity Assessment Upper Extremity Assessment: Generalized weakness;Defer to OT evaluation    Lower Extremity Assessment Lower Extremity Assessment: RLE deficits/detail;LLE deficits/detail RLE Deficits / Details: strength and ROM WFL LLE Deficits / Details: hip flexion 3-/5; knee flexion/extension at least 3/5 AROM; DF/PF at least 3/5 AROM    Cervical / Trunk Assessment Cervical / Trunk Assessment: Normal  Communication   Communication: No difficulties  Cognition Arousal/Alertness: Awake/alert Behavior During Therapy: Impulsive Overall Cognitive Status: No family/caregiver present to determine baseline cognitive functioning                                 General Comments: Verbose; difficulty attending to 1 step commands (gets distracted easily/decreased attention span)      General Comments General comments (skin integrity, edema, etc.): L hip/thigh bandage in place.  Nursing cleared pt for participation in physical therapy.  Pt agreeable to PT session.  NT and nurse each assisted therapist with transfers during session.    Exercises Total Joint Exercises Ankle Circles/Pumps: AROM;Strengthening;Both;10 reps;Supine Heel Slides: AAROM;Strengthening;Left;10 reps;Supine Hip ABduction/ADduction:  AAROM;Strengthening;Left;10 reps;Supine   Assessment/Plan    PT Assessment Patient needs continued PT services  PT Problem List Decreased strength;Decreased activity tolerance;Decreased balance;Decreased mobility;Decreased knowledge of use of DME;Decreased safety awareness;Decreased knowledge of precautions;Pain;Decreased skin integrity       PT Treatment Interventions DME instruction;Gait training;Stair training;Functional mobility training;Therapeutic activities;Therapeutic exercise;Balance training;Patient/family education    PT Goals (Current goals can be found in the Care Plan section)  Acute Rehab PT Goals Patient Stated Goal: to go home PT Goal Formulation: With patient Time For Goal Achievement: 01/21/19 Potential to Achieve Goals: Fair    Frequency BID   Barriers to discharge Decreased caregiver support      Co-evaluation               AM-PAC PT "6 Clicks" Mobility  Outcome Measure Help needed turning from your back to your side while in a flat bed without using bedrails?: A Little Help needed moving from lying on your back to sitting on the side of a flat bed without using bedrails?: A Little Help needed moving to and from a bed to a chair (including a wheelchair)?: Total Help needed standing up from a chair using your arms (e.g., wheelchair or bedside chair)?: Total Help needed to walk in hospital room?: Total Help needed climbing 3-5 steps with a railing? :  Total 6 Click Score: 10    End of Session Equipment Utilized During Treatment: Gait belt Activity Tolerance: Patient tolerated treatment well;No increased pain Patient left: in chair;with call bell/phone within reach;with chair alarm set;with nursing/sitter in room;with SCD's reapplied;Other (comment)(B heels floating via pillow) Nurse Communication: Mobility status;Precautions;Weight bearing status PT Visit Diagnosis: Other abnormalities of gait and mobility (R26.89);Unsteadiness on feet (R26.81);Muscle  weakness (generalized) (M62.81);History of falling (Z91.81);Difficulty in walking, not elsewhere classified (R26.2);Pain Pain - Right/Left: Left Pain - part of body: Hip    Time: 1010-1043 PT Time Calculation (min) (ACUTE ONLY): 33 min   Charges:   PT Evaluation $PT Eval Low Complexity: 1 Low PT Treatments $Therapeutic Activity: 8-22 mins       Hendricks Limes, PT 01/07/19, 11:14 AM [phone]

## 2019-01-07 NOTE — Plan of Care (Signed)
Patient states she is in no pain, and believes she should have been more careful so this would not have happened. Patient reassured that accidents happen. Patient states she sleeps on her right side and this injury interferes with her sleep.    Problem: Education: Goal: Verbalization of understanding the information provided (i.e., activity precautions, restrictions, etc) will improve Outcome: Progressing Goal: Individualized Educational Video(s) Outcome: Progressing   Problem: Activity: Goal: Ability to ambulate and perform ADLs will improve Outcome: Progressing   Problem: Clinical Measurements: Goal: Postoperative complications will be avoided or minimized Outcome: Progressing   Problem: Self-Concept: Goal: Ability to maintain and perform role responsibilities to the fullest extent possible will improve Outcome: Progressing   Problem: Pain Management: Goal: Pain level will decrease Outcome: Progressing   Problem: Education: Goal: Knowledge of General Education information will improve Description Including pain rating scale, medication(s)/side effects and non-pharmacologic comfort measures Outcome: Progressing   Problem: Health Behavior/Discharge Planning: Goal: Ability to manage health-related needs will improve Outcome: Progressing   Problem: Clinical Measurements: Goal: Ability to maintain clinical measurements within normal limits will improve Outcome: Progressing Goal: Will remain free from infection Outcome: Progressing Goal: Diagnostic test results will improve Outcome: Progressing Goal: Respiratory complications will improve Outcome: Progressing Goal: Cardiovascular complication will be avoided Outcome: Progressing   Problem: Activity: Goal: Risk for activity intolerance will decrease Outcome: Progressing   Problem: Nutrition: Goal: Adequate nutrition will be maintained Outcome: Progressing   Problem: Coping: Goal: Level of anxiety will  decrease Outcome: Progressing   Problem: Elimination: Goal: Will not experience complications related to bowel motility Outcome: Progressing Goal: Will not experience complications related to urinary retention Outcome: Progressing   Problem: Pain Managment: Goal: General experience of comfort will improve Outcome: Progressing   Problem: Safety: Goal: Ability to remain free from injury will improve Outcome: Progressing   Problem: Skin Integrity: Goal: Risk for impaired skin integrity will decrease Outcome: Progressing

## 2019-01-07 NOTE — Progress Notes (Signed)
Physical Therapy Treatment Patient Details Name: Nicole QualiaBarbara Jane Murillo MRN: [id number] DOB: [birth year] Today's Date: 01/07/2019    History of Present Illness Pt is a 73 y.o. female s[facility] [employer] with L hip pain.  Pt presented to hospital 01/06/19 and found to have impacted subcapital L hip fx.  Pt s/p cannulated L hip pinning 01/06/19.      PT Comments    Pt reporting no L hip/thigh pain during session.  Able to stand with max assist x1 up to RW (posterior lean upon standing initially) and then perform stand pivot with RW recliner to bed with max assist.  Pt requiring max cueing and physical assist to maintain L LE WB'ing precautions (pt required cueing to remember WB'ing precautions beginning of session but pt able to state WB'ing precautions end of session).  Pt had difficulty attending to 1 step commands (appears distracted easily with decreased attention span); selective attention/focus noted.  Attempted to discuss current assist levels, maintaining WB'ing precautions, and concerns for discharge but pt perseverating on home discharge and also her tv remote (therapist gave pt the tv remote and asked pt to not turn on the TV yet because she wanted to talk with the pt but pt turned on the tv anyway and then pt focused on the tv instead).  Will continue to focus on strengthening, maintaining WB'ing precautions, and progressive functional mobility during hospital stay.   Follow Up Recommendations  SNF     Equipment Recommendations  Rolling walker with 5" wheels;3in1 (PT);Wheelchair (measurements PT);Wheelchair cushion (measurements PT)    Recommendations for Other Services OT consult     Precautions / Restrictions Precautions Precautions: Fall Restrictions Weight Bearing Restrictions: Yes LLE Weight Bearing: Non weight bearing Other Position/Activity Restrictions: Multiple WB'ing noted in chart review; per secure text with MD Hyacinth MeekerMiller 01/07/19, "toe touch" WB'ing  is the goal    Mobility  Bed Mobility Overal bed mobility: Needs Assistance Bed Mobility: Sit to Supine     Sit to supine: Min assist;HOB elevated   General bed mobility comments: assist for L LE; vc's for technique  Transfers Overall transfer level: Needs assistance Equipment used: Rolling walker (2 wheeled) Transfers: Sit to/from [employer];Stand Pivot Transfers Sit to Stand: Max assist Stand pivot transfers: Max assist       General transfer comment: max assist x1 to stand up from recliner to RW with max cueing for UE/LE placement; stand pivot on R LE with RW max assist with max cueing for technique; max assist to control pt's descent onto bed; with all transfers pt requiring max cueing and physical assist for L LE WB'ing precautions and overall safety d/t impulsivity  Ambulation/Gait             General Gait Details: Not appropriate at this time d/t pt's difficulty maintaining WB'ing precautions and impulsivity   Stairs             Wheelchair Mobility    Modified Rankin (Stroke Patients Only)       Balance Overall balance assessment: Needs assistance Sitting-balance support: No upper extremity supported;Feet supported Sitting balance-Leahy Scale: Good Sitting balance - Comments: steady sitting reaching within BOS   Standing balance support: Bilateral upper extremity supported Standing balance-Leahy Scale: Poor Standing balance comment: posterior lean noted in standing up to RW initially requiring assist to shift weight forward  Cognition Arousal/Alertness: Awake/alert Behavior During Therapy: Impulsive Overall Cognitive Status: No family/caregiver present to determine baseline cognitive functioning                                 General Comments: Difficulty attending to 1 step commands (gets distracted easily/decreased attention span); selective attention      Exercises Total Joint Exercises Ankle  Circles/Pumps: AROM;Strengthening;Both;10 reps;Supine Heel Slides: AAROM;Strengthening;Left;10 reps;Supine Hip ABduction/ADduction: AAROM;Strengthening;Left;10 reps;Supine    General Comments General comments (skin integrity, edema, etc.): L hip/thigh bandage in place.  Pt agreeable to PT session.      Pertinent Vitals/Pain Pain Assessment: No/denies pain Pain Score: 0-No pain Pain Location: L hip/thigh Pain Intervention(s): Limited activity within patient's tolerance;Monitored during session;Repositioned    Home Living Family/patient expects to be discharged to:: Private residence Living Arrangements: Alone   Type of Home: Apartment Home Access: Level entry;Other (comment)(level entry from front; 1 step (no railing) from garage)   Home Layout: One level Home Equipment: Walker - 2 wheels;Grab bars - tub/shower;Cane - single point Additional Comments: Pt reports RW is from her mother and is in her "out building" and not sure how she will access it.    Prior Function Level of Independence: Independent with assistive device(s)      Comments: Recently using SPC in L hand.  Pt reports no other falls in past 6 months.   PT Goals (current goals can now be found in the care plan section) Acute Rehab PT Goals Patient Stated Goal: to go home PT Goal Formulation: With patient Time For Goal Achievement: 01/21/19 Potential to Achieve Goals: Fair Progress towards PT goals: Progressing toward goals    Frequency    BID      PT Plan Current plan remains appropriate    Co-evaluation              AM-PAC PT "6 Clicks" Mobility   Outcome Measure  Help needed turning from your back to your side while in a flat bed without using bedrails?: A Little Help needed moving from lying on your back to sitting on the side of a flat bed without using bedrails?: A Little Help needed moving to and from a bed to a chair (including a wheelchair)?: A Lot Help needed standing up from a chair  using your arms (e.g., wheelchair or bedside chair)?: A Lot Help needed to walk in hospital room?: Total Help needed climbing 3-5 steps with a railing? : Total 6 Click Score: 12    End of Session Equipment Utilized During Treatment: Gait belt Activity Tolerance: Patient tolerated treatment well;No increased pain Patient left: in bed;with call bell/phone within reach;with bed alarm set;with SCD's reapplied;Other (comment)(L LE Buck's traction placed (did not place SCD on L LE per discussion with pt's nurse); bed in lowest position) Nurse Communication: Mobility status;Precautions;Weight bearing status PT Visit Diagnosis: Other abnormalities of gait and mobility (R26.89);Unsteadiness on feet (R26.81);Muscle weakness (generalized) (M62.81);History of falling (Z91.81);Difficulty in walking, not elsewhere classified (R26.2);Pain Pain - Right/Left: Left Pain - part of body: Hip     Time: 3112-1624 PT Time Calculation (min) (ACUTE ONLY): 24 min  Charges:  $Therapeutic Exercise: 8-22 mins $Therapeutic Activity: 8-22 mins                    Hendricks Limes, PT 01/07/19, 2:22 PM [phone]

## 2019-01-08 ENCOUNTER — Encounter: Payer: Self-pay | Admitting: Specialist

## 2019-01-08 LAB — CBC
HCT: 38 % (ref 36.0–46.0)
Hemoglobin: 12.9 g/dL (ref 12.0–15.0)
MCH: 30.3 pg (ref 26.0–34.0)
MCHC: 33.9 g/dL (ref 30.0–36.0)
MCV: 89.2 fL (ref 80.0–100.0)
Platelets: 187 10*3/uL (ref 150–400)
RBC: 4.26 MIL/uL (ref 3.87–5.11)
RDW: 11.9 % (ref 11.5–15.5)
WBC: 7.4 10*3/uL (ref 4.0–10.5)
nRBC: 0 % (ref 0.0–0.2)

## 2019-01-08 LAB — BASIC METABOLIC PANEL WITH GFR
Anion gap: 7 (ref 5–15)
BUN: 11 mg/dL (ref 8–23)
CO2: 26 mmol/L (ref 22–32)
Calcium: 8.7 mg/dL — ABNORMAL LOW (ref 8.9–10.3)
Chloride: 106 mmol/L (ref 98–111)
Creatinine, Ser: 0.84 mg/dL (ref 0.44–1.00)
GFR calc Af Amer: >60 mL/min
GFR calc non Af Amer: >60 mL/min
Glucose, Bld: 116 mg/dL — ABNORMAL HIGH (ref 70–99)
Potassium: 3.7 mmol/L (ref 3.5–5.1)
Sodium: 139 mmol/L (ref 135–145)

## 2019-01-08 NOTE — Plan of Care (Signed)
Patient is pleasant and cooperative with care. Up using the bedside commode with assistance. Patient states she is hoping to not go into [facility] as both her parents died there. Her sister in law would like to talk to someone about her placement for discharge.    Problem: Education: Goal: Verbalization of understanding the information provided (i.e., activity precautions, restrictions, etc) will improve Outcome: Progressing Goal: Individualized Educational Video(s) Outcome: Progressing   Problem: Activity: Goal: Ability to ambulate and perform ADLs will improve Outcome: Progressing   Problem: Clinical Measurements: Goal: Postoperative complications will be avoided or minimized Outcome: Progressing   Problem: Self-Concept: Goal: Ability to maintain and perform role responsibilities to the fullest extent possible will improve Outcome: Progressing   Problem: Pain Management: Goal: Pain level will decrease Outcome: Progressing   Problem: Education: Goal: Knowledge of General Education information will improve Description Including pain rating scale, medication(s)/side effects and non-pharmacologic comfort measures Outcome: Progressing   Problem: Health Behavior/Discharge Planning: Goal: Ability to manage health-related needs will improve Outcome: Progressing   Problem: Clinical Measurements: Goal: Ability to maintain clinical measurements within normal limits will improve Outcome: Progressing Goal: Will remain free from infection Outcome: Progressing Goal: Diagnostic test results will improve Outcome: Progressing Goal: Respiratory complications will improve Outcome: Progressing Goal: Cardiovascular complication will be avoided Outcome: Progressing   Problem: Activity: Goal: Risk for activity intolerance will decrease Outcome: Progressing   Problem: Nutrition: Goal: Adequate nutrition will be maintained Outcome: Progressing   Problem: Coping: Goal: Level of anxiety  will decrease Outcome: Progressing   Problem: Elimination: Goal: Will not experience complications related to bowel motility Outcome: Progressing Goal: Will not experience complications related to urinary retention Outcome: Progressing   Problem: Pain Managment: Goal: General experience of comfort will improve Outcome: Progressing   Problem: Safety: Goal: Ability to remain free from injury will improve Outcome: Progressing   Problem: Skin Integrity: Goal: Risk for impaired skin integrity will decrease Outcome: Progressing

## 2019-01-08 NOTE — Progress Notes (Signed)
Physical Therapy Treatment Patient Details Name: Nicole Murillo MRN: [id number] DOB: 24-Dec-1945 Today's Date: 01/08/2019    History of Present Illness Pt is a 73 y.o. female [facility] [employer] [facility] L [facility] pain.  Pt presented to hospital 01/06/19 and found to have impacted subcapital L hip fx.  Pt s/p cannulated L hip pinning 01/06/19.      PT Comments    Pt in recliner, pt with feet off of side, stating she is ready to get back to bed.  Needed cues to put feet back up on footrest to safely put it down.  Pt on edge of chair ready to stand before walker was in place.  Stood with min assist to walker and turned to bed but sat without reaching back.  Education for safety.  Returned to supine with min a x 1 and [place] traction re-applied.  Pt continues to demonstrate overall decreased safety.  Pt ok for +1 assist for transfers but recommend +2 assist for gait.   Follow Up Recommendations  SNF     Equipment Recommendations  Rolling walker with 5" wheels;3in1 (PT);Wheelchair (measurements PT);Wheelchair cushion (measurements PT)    Recommendations for Other Services       Precautions / Restrictions Precautions Precautions: Fall Restrictions Weight Bearing Restrictions: Yes LLE Weight Bearing: Touchdown weight bearing Other Position/Activity Restrictions: Multiple WB'ing noted in chart review; per secure text with MD Hyacinth MeekerMiller 01/07/19, "toe touch" WB'ing is the goal    Mobility  Bed Mobility Overal bed mobility: Needs Assistance Bed Mobility: Sit to Supine     Supine to sit: Min assist;HOB elevated Sit to supine: Min assist   General bed mobility comments: assist for L LE; vc's for technique  Transfers Overall transfer level: Needs assistance Equipment used: Rolling walker (2 wheeled) Transfers: Sit to/from Stand Sit to Stand: Min assist            Ambulation/Gait Ambulation/Gait assistance: Editor, commissioningMin assist Gait Distance (Feet): 3 Feet Assistive  device: Rolling walker (2 wheeled) Gait Pattern/deviations: Step-to pattern;Decreased step length - right;Decreased step length - left Gait velocity: decreased   General Gait Details: remain with poor safety and unsafe to walk without +2 assist.   Stairs             Wheelchair Mobility    Modified Rankin (Stroke Patients Only)       Balance Overall balance assessment: Needs assistance Sitting-balance support: No upper extremity supported;Feet supported Sitting balance-Leahy Scale: Good     Standing balance support: Bilateral upper extremity supported Standing balance-Leahy Scale: Poor Standing balance comment: heavy reliance on walker and requires hands on assist at all times.                            Cognition Arousal/Alertness: Awake/alert Behavior During Therapy: WFL for tasks assessed/performed Overall Cognitive Status: Within Functional Limits for tasks assessed                                 General Comments: enaged today but remains focused on remote/TV during session      Exercises Other Exercises Other Exercises: seated ankle pumps, LAQ, marches, ab/add and ankle pumps x 10    General Comments        Pertinent Vitals/Pain Pain Assessment: Faces Faces Pain Scale: Hurts a little bit Pain Location: L hip/thigh - stated significantly inproved today Pain  Descriptors / Indicators: Sore Pain Intervention(s): Limited activity within patient's tolerance;Monitored during session    Home Living                      Prior Function            PT Goals (current goals can now be found in the care plan section) Progress towards PT goals: Progressing toward goals    Frequency    BID      PT Plan Current plan remains appropriate    Co-evaluation              AM-PAC PT "6 Clicks" Mobility   Outcome Measure  Help needed turning from your back to your side while in a flat bed without using bedrails?: A  Little Help needed moving from lying on your back to sitting on the side of a flat bed without using bedrails?: A Little Help needed moving to and from a bed to a chair (including a wheelchair)?: A Little Help needed standing up from a chair using your arms (e.g., wheelchair or bedside chair)?: A Little Help needed to walk in hospital room?: A Lot Help needed climbing 3-5 steps with a railing? : Total 6 Click Score: 15    End of Session Equipment Utilized During Treatment: Gait belt Activity Tolerance: Patient tolerated treatment well Patient left: in bed;with bed alarm set;with call bell/phone within reach Nurse Communication: Mobility status Pain - Right/Left: Left Pain - part of body: Hip     Time: 1194-1740 PT Time Calculation (min) (ACUTE ONLY): 13 min  Charges:  $Therapeutic Activity: 8-22 mins                     Danielle Dess, PTA 01/08/19, 12:43 PM

## 2019-01-08 NOTE — Progress Notes (Signed)
Subjective: 2 Days Post-Op Procedure(s) (LRB): CANNULATED HIP PINNING (Left)    Patient reports pain as mild.  Working with PT.  Wants to go home with a walker.  Maintaining touch down weightbearing  Objective:   VITALS:   Vitals:   01/07/19 1958 01/08/19 0358  BP: (!) 151/94 138/77  Pulse: 97 77  Resp: 18 18  Temp: 98.7 F (37.1 C) 98.1 F (36.7 C)  SpO2: 94% 91%    Neurologically intact ABD soft Neurovascular intact Sensation intact distally Intact pulses distally Dorsiflexion/Plantar flexion intact Incision: no drainage  LABS Recent Labs    01/06/19 0855 01/07/19 0429 01/08/19 0426  HGB 14.2 12.7 12.9  HCT 41.3 37.5 38.0  WBC 8.9 8.0 7.4  PLT 189 173 187    Recent Labs    01/06/19 0855 01/07/19 0429 01/08/19 0426  NA 140 138 139  K 3.7 3.8 3.7  BUN 16 12 11   CREATININE 0.96 0.77 0.84  GLUCOSE 124* 107* 116*    Recent Labs    01/06/19 0855  INR 1.0     Assessment/Plan: 2 Days Post-Op Procedure(s) (LRB): CANNULATED HIP PINNING (Left)   Advance diet Up with therapy D/C IV fluids Discharge to SNF   Discharge on enteric-coated ASA 325 mg twice daily  Return to clinic 2 weeks.

## 2019-01-08 NOTE — Progress Notes (Signed)
[facility] [place] [facility]   PATIENT NAME: Nicole Murillo    MR#:  [id number]  DATE OF BIRTH:  Nov 15, 1945  SUBJECTIVE:  CHIEF COMPLAINT:   Chief Complaint  Patient presents with  . Fall   The patient said left hip pain is better. REVIEW OF SYSTEMS:  Review of Systems  Constitutional: Positive for malaise/fatigue. Negative for chills and fever.  HENT: Negative for sore throat.   Eyes: Negative for blurred vision and double vision.  Respiratory: Negative for cough, hemoptysis, shortness of breath, wheezing and stridor.   Cardiovascular: Negative for chest pain, palpitations, orthopnea and leg swelling.  Gastrointestinal: Negative for abdominal pain, blood in stool, diarrhea, melena, nausea and vomiting.  Genitourinary: Negative for dysuria, flank pain and hematuria.  Musculoskeletal: Positive for joint pain. Negative for back pain.  Skin: Negative for rash.  Neurological: Negative for dizziness, sensory change, focal weakness, seizures, loss of consciousness, weakness and headaches.  Endo/Heme/Allergies: Negative for polydipsia.  Psychiatric/Behavioral: Negative for depression. The patient is not nervous/anxious.     DRUG ALLERGIES:  No Known Allergies VITALS:  Blood pressure 138/77, pulse 77, temperature 98.1 F (36.7 C), resp. rate 18, height 5\' 4"  (1.626 m), weight 93.4 kg, SpO2 91 %. PHYSICAL EXAMINATION:  Physical Exam Constitutional:      General: She is not in acute distress.    Appearance: Normal appearance.  HENT:     Head: Normocephalic.     Mouth/Throat:     Mouth: Mucous membranes are moist.  Eyes:     General: No scleral icterus.    Conjunctiva/sclera: Conjunctivae normal.     Pupils: Pupils are equal, round, and reactive to light.  Neck:     Musculoskeletal: Normal range of motion and neck supple.     Vascular: No JVD.     Trachea: No tracheal deviation.  Cardiovascular:     Rate and Rhythm: Normal rate and regular rhythm.      Heart sounds: Normal heart sounds. No murmur. No gallop.   Pulmonary:     Effort: Pulmonary effort is normal. No respiratory distress.     Breath sounds: Normal breath sounds. No wheezing or rales.  Abdominal:     General: Bowel sounds are normal. There is no distension.     Palpations: Abdomen is soft.     Tenderness: There is no abdominal tenderness. There is no rebound.  Musculoskeletal: Normal range of motion.        General: No tenderness.     Right lower leg: No edema.     Left lower leg: No edema.  Skin:    Findings: No erythema or rash.  Neurological:     General: No focal deficit present.     Mental Status: She is alert and oriented to person, place, and time.     Cranial Nerves: No cranial nerve deficit.  Psychiatric:        Mood and Affect: Mood normal.    LABORATORY PANEL:  Female CBC Recent Labs  Lab 01/08/19 0426  WBC 7.4  HGB 12.9  HCT 38.0  PLT 187   ------------------------------------------------------------------------------------------------------------------ Chemistries  Recent Labs  Lab 01/08/19 0426  NA 139  K 3.7  CL 106  CO2 26  GLUCOSE 116*  BUN 11  CREATININE 0.84  CALCIUM 8.7*   RADIOLOGY:  No results found. ASSESSMENT AND PLAN:   Left hip fracture. S/p left hip surgery.  Pain control. DVT prophylaxis and PT. PT evaluation suggest skilled nursing facility  placement.  The patient prefers to go home with home health and physical therapy. Continue PT.  Hopefully discharge home with home health and PT.  All the records are reviewed and case discussed with Care Management/Social Worker. Management plans discussed with the patient, her brother and they are in agreement.  CODE STATUS: Full Code  TOTAL TIME TAKING CARE OF THIS PATIENT: 22 minutes.   More than 50% of the time was spent in counseling/coordination of care: YES  POSSIBLE D/C IN 2 DAYS, DEPENDING ON CLINICAL CONDITION.  Shaune PollackQing Trevion Hoben M.D on 01/08/2019 at 11:32 AM   Between 7am to 6pm - Pager - [phone]  After 6pm go to [email or web address] - Therapist, nutritionalpassword EPAS [facility]  [facility] [place] [facility]

## 2019-01-08 NOTE — Progress Notes (Signed)
Physical Therapy Treatment Patient Details Name: Edward QualiaBarbara Jane Bobst MRN: [id number] DOB: 1946-07-16 Today's Date: 01/08/2019    History of Present Illness Pt is a 73 y.o. female [facility] [employer] with L hip pain.  Pt presented to hospital 01/06/19 and found to have impacted subcapital L hip fx.  Pt s/p cannulated L hip pinning 01/06/19.      PT Comments    Pt in bed, ready to get up.  Stated she is feeling less pain today.  To edge of bed with increased time and good effort requiring min guard.  Once sitting, steady,  She is unable to recall WB precautions this AM so they were reviewed.  Pt stood with min a x 1 and min guard x 1 for safety and anticipated need for +2 assist.  She was able to take several small steps with verbal cues to maintain WB status.  Sat in chair with min assist and vc/tactile cues for hand placements.  Participated in exercises as described below.  Significant improvement this AM with transfer status but SNF remains appropriate upon discharge.   Follow Up Recommendations  SNF     Equipment Recommendations  Rolling walker with 5" wheels;3in1 (PT);Wheelchair (measurements PT);Wheelchair cushion (measurements PT)    Recommendations for Other Services       Precautions / Restrictions Precautions Precautions: Fall Restrictions Weight Bearing Restrictions: Yes LLE Weight Bearing: Touchdown weight bearing Other Position/Activity Restrictions: Multiple WB'ing noted in chart review; per secure text with MD Hyacinth MeekerMiller 01/07/19, "toe touch" WB'ing is the goal    Mobility  Bed Mobility Overal bed mobility: Needs Assistance Bed Mobility: Supine to Sit     Supine to sit: Min assist;HOB elevated     General bed mobility comments: assist for L LE; vc's for technique  Transfers Overall transfer level: Needs assistance Equipment used: Rolling walker (2 wheeled) Transfers: Sit to/from Stand Sit to Stand: Min assist;+2 physical assistance             Ambulation/Gait Ambulation/Gait assistance: Editor, commissioningMin assist Gait Distance (Feet): 3 Feet Assistive device: Rolling walker (2 wheeled) Gait Pattern/deviations: Step-to pattern;Decreased step length - right;Decreased step length - left Gait velocity: decreased   General Gait Details: Not appropriate for gait due to difficulty maintaing WB status but she was able to actively particiapte in transfer to chair today and did well maintiaing for short distnance/time   Stairs             Wheelchair Mobility    Modified Rankin (Stroke Patients Only)       Balance Overall balance assessment: Needs assistance Sitting-balance support: No upper extremity supported;Feet supported Sitting balance-Leahy Scale: Good     Standing balance support: Bilateral upper extremity supported Standing balance-Leahy Scale: Poor Standing balance comment: heavy reliance on walker and requires hands on assist at all times.                            Cognition Arousal/Alertness: Awake/alert Behavior During Therapy: WFL for tasks assessed/performed Overall Cognitive Status: Within Functional Limits for tasks assessed                                 General Comments: enaged today but remains focused on remote/TV during session      Exercises Other Exercises Other Exercises: seated ankle pumps, LAQ, marches, ab/add and ankle pumps x 10  General Comments        Pertinent Vitals/Pain Pain Assessment: Faces Faces Pain Scale: Hurts a little bit Pain Location: L hip/thigh - stated significantly inproved today Pain Intervention(s): Monitored during session    Home Living                      Prior Function            PT Goals (current goals can now be found in the care plan section) Progress towards PT goals: Progressing toward goals    Frequency    BID      PT Plan Current plan remains appropriate    Co-evaluation               AM-PAC PT "6 Clicks" Mobility   Outcome Measure  Help needed turning from your back to your side while in a flat bed without using bedrails?: A Little Help needed moving from lying on your back to sitting on the side of a flat bed without using bedrails?: A Little Help needed moving to and from a bed to a chair (including a wheelchair)?: A Little Help needed standing up from a chair using your arms (e.g., wheelchair or bedside chair)?: A Little Help needed to walk in hospital room?: A Lot Help needed climbing 3-5 steps with a railing? : Total 6 Click Score: 15    End of Session Equipment Utilized During Treatment: Gait belt Activity Tolerance: Patient tolerated treatment well Patient left: in chair;with call bell/phone within reach;with chair alarm set Nurse Communication: Mobility status Pain - Right/Left: Left Pain - part of body: Hip     Time: 3614-4315 PT Time Calculation (min) (ACUTE ONLY): 11 min  Charges:  $Therapeutic Activity: 8-22 mins                     Danielle Dess, PTA 01/08/19, 10:22 AM

## 2019-01-09 LAB — CBC
HCT: 38.3 % (ref 36.0–46.0)
Hemoglobin: 12.7 g/dL (ref 12.0–15.0)
MCH: 29.9 pg (ref 26.0–34.0)
MCHC: 33.2 g/dL (ref 30.0–36.0)
MCV: 90.1 fL (ref 80.0–100.0)
Platelets: 198 10*3/uL (ref 150–400)
RBC: 4.25 MIL/uL (ref 3.87–5.11)
RDW: 11.9 % (ref 11.5–15.5)
WBC: 6.4 10*3/uL (ref 4.0–10.5)
nRBC: 0 % (ref 0.0–0.2)

## 2019-01-09 MED ORDER — BISACODYL 5 MG PO TBEC: 5.0000 mg | DELAYED_RELEASE_TABLET | Freq: Every day | ORAL | 0 refills | Status: DC | PRN

## 2019-01-09 MED ORDER — ASPIRIN 325 MG PO TABS
325.0000 mg | ORAL_TABLET | Freq: Two times a day (BID) | ORAL | Status: DC
  Filled 2019-01-09 (×2): qty 1

## 2019-01-09 MED ORDER — SENNOSIDES-DOCUSATE SODIUM 8.6-50 MG PO TABS: 1.0000 | ORAL_TABLET | Freq: Every evening | ORAL | Status: DC | PRN

## 2019-01-09 MED ORDER — HYDROCODONE-ACETAMINOPHEN 5-325 MG PO TABS: 1.0000 | ORAL_TABLET | Freq: Four times a day (QID) | ORAL | 0 refills | Status: DC | PRN

## 2019-01-09 MED ORDER — ASPIRIN 325 MG PO TABS: 325.0000 mg | ORAL_TABLET | Freq: Two times a day (BID) | ORAL | 0 refills | Status: AC

## 2019-01-09 NOTE — TOC Progression Note (Signed)
Transition of Care [facility]) - Progression Note    Patient Details  Name: Neeka Degood MRN: [id number] Date of Birth: 1945/11/02  [facility] CM/SW Contact  Barrie Dunker, RN Phone Number: 01/09/2019, 10:38 AM  Clinical Narrative:     Spoke with the patient regarding bed offers, we reviewed them in detail she chose [facility] care because her mother had went there after a stroke. I called [facility] and the room she will go to is 36 B, starting the paperwork to go today     Barriers to Discharge: No Barriers Identified  Expected Discharge Plan and Services     Discharge Planning Services: CM Consult Post Acute Care Choice: NA Living arrangements for the past 2 months: Apartment(single level condo) Expected Discharge Date: 01/09/19               DME Arranged: N/A         HH Arranged: NA HH Agency: NA         Social Determinants of Health (SDOH) Interventions    Readmission Risk Interventions No flowsheet data found.

## 2019-01-09 NOTE — Progress Notes (Signed)
Subjective: 3 Days Post-Op Procedure(s) (LRB): CANNULATED HIP PINNING (Left)    Patient reports pain as mild.  Doing better and working with PT.  Ready for rehab.  Objective:   VITALS:   Vitals:   01/09/19 0745 01/09/19 1329  BP: (!) 150/94 (!) 128/92  Pulse: 91 (!) 111  Resp: 16   Temp: 98 F (36.7 C)   SpO2: 92% 97%    Neurologically intact ABD soft Neurovascular intact Sensation intact distally Intact pulses distally Dorsiflexion/Plantar flexion intact Incision: no drainage  LABS Recent Labs    01/07/19 0429 01/08/19 0426 01/09/19 0444  HGB 12.7 12.9 12.7  HCT 37.5 38.0 38.3  WBC 8.0 7.4 6.4  PLT 173 187 198    Recent Labs    01/07/19 0429 01/08/19 0426  NA 138 139  K 3.8 3.7  BUN 12 11  CREATININE 0.77 0.84  GLUCOSE 107* 116*    No results for input(s): LABPT, INR in the last 72 hours.   Assessment/Plan: 3 Days Post-Op Procedure(s) (LRB): CANNULATED HIP PINNING (Left)   Continue PT. Ready for discharge to rehab. Remain toe-touch only on the left.  Return to clinic 10 days for x-ray.  Enteric-coated aspirin 325 mg twice daily for 6 weeks.

## 2019-01-09 NOTE — Progress Notes (Signed)
Physical Therapy Treatment Patient Details Name: Nicole Murillo MRN: [id number] DOB: 08/13/1946 Today's Date: 01/09/2019    History of Present Illness Pt is a 72 y.o. female s/p fall 01/05/19 [place] [facility] with L hip pain.  Pt presented to hospital 01/06/19 and found to have impacted subcapital L hip fx.  Pt s/p cannulated L hip pinning 01/06/19.      PT Comments    Patient in bed and amenable to therapy on PT/OT arrival. Patient able to articulate WB precautions, but is not able to consistently demonstrate them. Patient demonstrates requisite strength for bed mobility with SBA, HOB elevated, and min use of bed rails, but does not demonstrate appropriate safety when performing. Patient moves impulsively throughout session with minimal/no adherence to verbal, visual, and tactile cues. Patient demonstrates ~50% ability to maintain WB precautions during session. Patient abandons RW to initiate descent to seating surfaces when hips are perpendicular to surface demonstrating a significantly increased risk of falls. Patient will continue to benefit from skilled therapeutic intervention to address deficits in strength, mobility, balance, and safety. Patient provided with continuous multi-modal cueing throughout session to promote understanding and demonstration of safe mobility with minimal perceived impact on patient's understanding.  Patient in bed; all needs in reach/met.    Follow Up Recommendations  SNF     Equipment Recommendations  Rolling walker with 5" wheels;3in1 (PT);Wheelchair (measurements PT);Wheelchair cushion (measurements PT)    Recommendations for Other Services       Precautions / Restrictions Precautions Precautions: Fall Restrictions Weight Bearing Restrictions: Yes LLE Weight Bearing: Touchdown weight bearing Other Position/Activity Restrictions: Pt has significant difficulty maintaining TWB    Mobility  Bed Mobility Overal bed mobility: Needs  Assistance Bed Mobility: Sit to Supine     Supine to sit: Supervision;HOB elevated Sit to supine: Supervision;HOB elevated   General bed mobility comments: Patient able to perform though impulsive.  Transfers Overall transfer level: Needs assistance Equipment used: Rolling walker (2 wheeled) Transfers: Sit to/from Stand Sit to Stand: Min assist Stand pivot transfers: Min assist;+2 safety/equipment       General transfer comment: Patient requires consistent VCs and step-by-step commands to maintain precuations/WB with estimated 50% success rate. Patient mvoes with impulsivity and little regard for seating surfaces. Patient is at high risk of fall d/t to safety disregard.  Ambulation/Gait Ambulation/Gait assistance: Min assist Gait Distance (Feet): 5 Feet Assistive device: Rolling walker (2 wheeled) Gait Pattern/deviations: Step-to pattern;Decreased step length - right;Decreased step length - left Gait velocity: decreased   General Gait Details: Patient unable to maintain WB precautions with any consistency in the presence of continuous verbal and visual cues.   Stairs             Wheelchair Mobility    Modified Rankin (Stroke Patients Only)       Balance Overall balance assessment: Needs assistance Sitting-balance support: No upper extremity supported;Feet supported Sitting balance-Leahy Scale: Good     Standing balance support: Bilateral upper extremity supported Standing balance-Leahy Scale: Poor Standing balance comment: heavy reliance on walker and requires hands on assist at all times.                            Cognition Arousal/Alertness: Awake/alert Behavior During Therapy: WFL for tasks assessed/performed Overall Cognitive Status: Within Functional Limits for tasks assessed                                   General Comments: Patient demonstrates high distractibility throughout session      Exercises Other  Exercises Other Exercises: Functional transfers: bed mobility, STS, SPT, BSC>bed. See transfer section for details. Other Exercises: Precautions education: Extensive education provided. Patient able to articulate but does not demonstrate understanding. Patient provided with multi-modal cueing throughout session with frequent assessment for understanding. Patient inconsistent with demonstration. Other Exercises: Patient educated on colon massage self-practice to encourage BM.    General Comments        Pertinent Vitals/Pain Pain Assessment: 0-10 Pain Score: 5  Pain Location: L hip/thigh - states pain is much improved from initial Pain Intervention(s): Limited activity within patient's tolerance;Monitored during session;Repositioned    Home Living                      Prior Function            PT Goals (current goals can now be found in the care plan section) Acute Rehab PT Goals Patient Stated Goal: to go home PT Goal Formulation: With patient Time For Goal Achievement: 01/21/19 Potential to Achieve Goals: Fair Progress towards PT goals: Progressing toward goals    Frequency    BID      PT Plan Current plan remains appropriate    Co-evaluation PT/OT/SLP Co-Evaluation/Treatment: Yes Reason for Co-Treatment: For patient/therapist safety;To address functional/ADL transfers PT goals addressed during session: Mobility/safety with mobility;Proper use of DME;Balance OT goals addressed during session: ADL's and self-care;Proper use of Adaptive equipment and DME      AM-PAC PT "6 Clicks" Mobility   Outcome Measure  Help needed turning from your back to your side while in a flat bed without using bedrails?: A Little Help needed moving from lying on your back to sitting on the side of a flat bed without using bedrails?: A Little Help needed moving to and from a bed to a chair (including a wheelchair)?: A Little Help needed standing up from a chair using your arms  (e.g., wheelchair or bedside chair)?: A Little Help needed to walk in hospital room?: A Lot Help needed climbing 3-5 steps with a railing? : Total 6 Click Score: 15    End of Session Equipment Utilized During Treatment: Gait belt Activity Tolerance: Patient tolerated treatment well;Other (comment)(Patient nonadherent to precautions and VCs for safety) Patient left: in bed;with bed alarm set;with call bell/phone within reach;Other (comment);with SCD's reapplied(traction reapplied) Nurse Communication: Mobility status PT Visit Diagnosis: Other abnormalities of gait and mobility (R26.89);Unsteadiness on feet (R26.81);Muscle weakness (generalized) (M62.81);History of falling (Z91.81);Difficulty in walking, not elsewhere classified (R26.2);Pain Pain - Right/Left: Left Pain - part of body: Hip     Time: 0920-0952 PT Time Calculation (min) (ACUTE ONLY): 32 min  Charges:  $Therapeutic Activity: 23-37 mins                    Katlin Harker PT, DPT #[id number] 01/09/2019, 10:11 AM   

## 2019-01-09 NOTE — Evaluation (Signed)
Occupational Therapy Evaluation Patient Details Name: Nicole Murillo MRN: [id number] DOB: 18-May-1946 Today's Date: 01/09/2019    History of Present Illness Pt is a 73 y.o. female [facility] [facility] pain.  Pt presented to hospital 01/06/19 and found to have impacted subcapital L hip fx.  Pt s/p cannulated L hip pinning 01/06/19.     Clinical Impression   Pt seen for OT/PT co-evaluation/treatment this date. Prior to surgery, pt was modified independent in all ADLs, however occasionally using SPC for mobility due to concerns over falling. Pt is eager to return to PLOF with less pain and improved safety and independence. Pt currently requires mod assist for LB dressing and bathing while in seated position due to pain and limited AROM of L hip. Pt also requires +2 assist for all OOB activity 2/2 decreased safety awareness and instability during functional mobility.Pt instructed in self care skills, falls prevention strategies, home/routines modifications, and strategies for maintaining TDWB status in LLE. Pt demonstrated high impusivity with movement and decreased safety judgement t/o session. Therapists provided step-by step commands and multi-modal cueing to promote improved safety and adherence to TDWB status during functional transfer to bariatric BSC. Pt was able to return verbalize safety instructions but consistently unable to demonstrate with activity. Pt returned to bed after use of BSC with bucks traction and SCD's re-applied. Pt would benefit from additional instruction in self care skills and techniques to help maintain precautions with or without assistive devices to support recall and carryover prior to discharge. Recommend STR upon discharge.      Follow Up Recommendations  SNF    Equipment Recommendations  3 in 1 bedside commode;Tub/shower bench    Recommendations for Other Services       Precautions / Restrictions Precautions Precautions:  Fall Restrictions Weight Bearing Restrictions: Yes LLE Weight Bearing: Touchdown weight bearing Other Position/Activity Restrictions: Pt has significant difficulty maintaining TWB      Mobility Bed Mobility Overal bed mobility: Needs Assistance Bed Mobility: Sit to Supine     Supine to sit: Supervision;HOB elevated Sit to supine: Supervision;HOB elevated   General bed mobility comments: Patient able to perform though impulsive.  Transfers Overall transfer level: Needs assistance Equipment used: Rolling walker (2 wheeled) Transfers: Sit to/from Stand Sit to Stand: Min assist Stand pivot transfers: Min assist;+2 safety/equipment       General transfer comment: Patient requires consistent VCs and step-by-step commands to maintain precuations/WB with estimated 50% success rate. Patient mvoes with impulsivity and little regard for seating surfaces. Patient is at high risk of fall d/t to safety disregard.    Balance Overall balance assessment: Needs assistance Sitting-balance support: No upper extremity supported;Feet supported Sitting balance-Leahy Scale: Good Sitting balance - Comments: steady sitting reaching within BOS   Standing balance support: Bilateral upper extremity supported Standing balance-Leahy Scale: Poor Standing balance comment: heavy reliance on walker and requires hands on assist at all times.                           ADL either performed or assessed with clinical judgement   ADL Overall ADL's : Needs assistance/impaired Eating/Feeding: Set up;Sitting   Grooming: Set up;Supervision/safety;Sitting   Upper Body Bathing: Set up;Supervision/ safety;Sitting   Lower Body Bathing: Set up;Moderate assistance;Cueing for safety;With adaptive equipment;Sitting/lateral leans;+2 for safety/equipment   Upper Body Dressing : Set up;Sitting;Supervision/safety   Lower Body Dressing: Set up;Moderate assistance;Cueing for safety;With adaptive equipment;Sit  to/from stand;+2 for physical assistance;+2 for safety/equipment   Toilet Transfer: Set up;+2 for safety/equipment;Moderate assistance;BSC;RW;Ambulation Toilet Transfer Details (indicate cue type and reason): Pt required heavy VC's for safety and maintaining TDWB status during toilet transfer on this date. Pt had difficult time follwing even single-step verbal prompts from therapists. Tended to want to move quickly and disregarded safety instructions.  Toileting- Clothing Manipulation and Hygiene: Set up;Supervision/safety;Sitting/lateral lean Toileting - Clothing Manipulation Details (indicate cue type and reason): Pt able to complete toilet hygine with set-up on this date. [facility] provided to promote improved functional independence with use of toilet.      Functional mobility during ADLs: +2 for safety/equipment;Rolling walker;Minimal assistance;Moderate assistance General ADL Comments: Pt eager to get back to PLOF and at times demonstrates poor safety judgement particularly during functional mobility. Required heavy multi-modal cueing during ADLs in order to maintain WB precautions.      Vision Baseline Vision/History: Wears glasses;Cataracts Wears Glasses: At all times Patient Visual Report: No change from baseline       Perception     Praxis      Pertinent Vitals/Pain Pain Assessment: 0-10 Pain Score: 5  Pain Location: L hip/thigh - states pain is much improved from initial Pain Descriptors / Indicators: Sore Pain Intervention(s): Limited activity within patient's tolerance;Monitored during session;Repositioned     Hand Dominance Right   Extremity/Trunk Assessment Upper Extremity Assessment Upper Extremity Assessment: Overall WFL for tasks assessed(Pt able to use BUE for bed mobility and to support self using RW. Demonstrates good strength overall, BUE WFL. )   Lower Extremity Assessment Lower Extremity Assessment: Defer to PT evaluation RLE Deficits / Details: strength  and ROM WFL   Cervical / Trunk Assessment Cervical / Trunk Assessment: Normal   Communication Communication Communication: HOH(Pt endorses she should be wearing hearing aids. Occasionally had difficulty hearing therapist. )   Cognition Arousal/Alertness: Awake/alert Behavior During Therapy: WFL for tasks assessed/performed;Impulsive Overall Cognitive Status: No family/caregiver present to determine baseline cognitive functioning                                 General Comments: Patient demonstrates high distractibility throughout session   General Comments  L hip/thigh bandage in place. Bucks traction re-applied once pt back to bed.     Exercises Other Exercises Other Exercises: Functional transfers: bed mobility, STS, SPT, BSC>bed. See transfer section for details. Other Exercises: Precautions education: Extensive education provided. Patient able to articulate but does not demonstrate understanding. Patient provided with multi-modal cueing throughout session with frequent assessment for understanding. Patient inconsistent with demonstration. Other Exercises: Patient educated on colon massage self-practice to encourage BM.   Shoulder Instructions      Home Living Family/patient expects to be discharged to:: Private residence Living Arrangements: Alone   Type of Home: Apartment Home Access: Level entry;Other (comment)     Home Layout: One level     Bathroom Shower/Tub: Chief Strategy Officer: Standard Bathroom Accessibility: No(Pt unsure, but doesn't think so. )   Home Equipment: Walker - 2 wheels;Grab bars - tub/shower;Cane - single point   Additional Comments: Per char, pt reports she is unsure if she will be able to access RW.       Prior Functioning/Environment Level of Independence: Independent with assistive device(s)        Comments: Recently using SPC in L hand.  Pt reports no other falls in past 6 months.  OT Problem List:  Decreased strength;Impaired balance (sitting and/or standing);Decreased knowledge of precautions;Pain;Decreased range of motion;Decreased safety awareness;Obesity;Decreased activity tolerance;Decreased coordination;Decreased knowledge of use of DME or AE      OT Treatment/Interventions: Self-care/ADL training;Therapeutic exercise;Manual therapy;Modalities;Patient/family education;Energy conservation;Therapeutic activities;DME and/or AE instruction;Balance training    OT Goals(Current goals can be found in the care plan section) Acute Rehab OT Goals Patient Stated Goal: to go home OT Goal Formulation: With patient Time For Goal Achievement: 01/23/19 Potential to Achieve Goals: Fair ADL Goals Pt Will Perform Grooming: sitting;with set-up;with adaptive equipment(With LRAD PRN for safety and improved functional independence.) Pt Will Perform Lower Body Dressing: with min assist;sit to/from stand;with adaptive equipment(With LRAD PRN for safety and improved functional independence.) Pt Will Transfer to Toilet: with min assist;bedside commode;ambulating(With LRAD PRN for safety and improved functional independence.) Additional ADL Goal #1: Pt will independently verbalize a plan to implement at least 3 learned falls prevention strategies into her daily routines for improved safety and independence during meaningful occupations of daily life upon hospital DC.  OT Frequency: Min 2X/week   Barriers to D/C: Decreased caregiver support  Pt lives alone.        Co-evaluation PT/OT/SLP Co-Evaluation/Treatment: Yes Reason for Co-Treatment: For patient/therapist safety;To address functional/ADL transfers PT goals addressed during session: Mobility/safety with mobility;Proper use of DME OT goals addressed during session: ADL's and self-care;Proper use of Adaptive equipment and DME      AM-PAC OT "6 Clicks" Daily Activity     Outcome Measure Help from another person eating meals?: None Help from another  person taking care of personal grooming?: A Little Help from another person toileting, which includes using toliet, bedpan, or urinal?: A Lot Help from another person bathing (including washing, rinsing, drying)?: A Lot Help from another person to put on and taking off regular upper body clothing?: A Little Help from another person to put on and taking off regular lower body clothing?: A Lot 6 Click Score: 16   End of Session Equipment Utilized During Treatment: Gait belt;Rolling walker  Activity Tolerance: Patient tolerated treatment well Patient left: in bed;with call bell/phone within reach;with bed alarm set;with SCD's reapplied;Other (comment)(With bucks traction in place LLE)  OT Visit Diagnosis: Other abnormalities of gait and mobility (R26.89);History of falling (Z91.81);Pain Pain - Right/Left: Left Pain - part of body: Hip                Time: 0920-0952 OT Time Calculation (min): 32 min Charges:  OT General Charges $OT Visit: 1 Visit OT Evaluation $OT Eval Moderate Complexity: 1 Mod  [place] Marchiano Smith Robert, M.S., OTR/L Ascom: [phone] 01/09/19, 11:00 AM

## 2019-01-09 NOTE — Discharge Summary (Signed)
[facility] [place] [facility]   PATIENT NAME: Nicole Murillo    MR#:  [id number]  DATE OF BIRTH:  Jan 09, 1946  DATE OF ADMISSION:  01/06/2019   ADMITTING PHYSICIAN: Shaune Pollack, MD  DATE OF DISCHARGE: 01/09/2019  PRIMARY CARE PHYSICIAN: Patient, No Pcp Per   ADMISSION DIAGNOSIS:  Hip fracture [facility]) [S72.009A] Surgery, elective [Z41.9] Fall, initial encounter [W19.XXXA] DISCHARGE DIAGNOSIS:  Active Problems:   Closed left hip fracture (HCC)  SECONDARY DIAGNOSIS:   Past Medical History:  Diagnosis Date  . Environmental allergies    HOSPITAL COURSE:  Left hip fracture. S/p left hip surgery.  Pain control. DVT prophylaxisandPT. Aspirin 325 mg p.o. twice daily per Dr. Hyacinth Meeker. PT evaluation suggest skilled nursing facility placement.  The patient and her brother prefer to go to SNF. DISCHARGE CONDITIONS:  Stable, discharge to skilled nursing facility today. CONSULTS OBTAINED:  Treatment Team:  Deeann Saint, MD DRUG ALLERGIES:  No Known Allergies DISCHARGE MEDICATIONS:   Allergies as of 01/09/2019   No Known Allergies     Medication List    STOP taking these medications   ibuprofen 200 MG tablet Commonly known as:  ADVIL     TAKE these medications   aspirin 325 MG tablet Take 1 tablet (325 mg total) by mouth 2 (two) times a day for 14 days.   bisacodyl 5 MG EC tablet Commonly known as:  DULCOLAX Take 1 tablet (5 mg total) by mouth daily as needed for moderate constipation.   calcium carbonate 1250 (500 Ca) MG tablet Commonly known as:  OS-CAL - dosed in mg of elemental calcium Take 1 tablet by mouth daily with breakfast.   HYDROcodone-acetaminophen 5-325 MG tablet Commonly known as:  NORCO/VICODIN Take 1 tablet by mouth every 6 (six) hours as needed for moderate pain (pain score 4-6).   multivitamin tablet Take 1 tablet by mouth daily.   senna-docusate 8.6-50 MG tablet Commonly known as:  Senokot-S Take 1 tablet by mouth at  bedtime as needed for mild constipation.        DISCHARGE INSTRUCTIONS:  See AVS  If you experience worsening of your admission symptoms, develop shortness of breath, life threatening emergency, suicidal or homicidal thoughts you must seek medical attention immediately by calling 911 or calling your MD immediately  if symptoms less severe.  You Must read complete instructions/literature along with all the possible adverse reactions/side effects for all the Medicines you take and that have been prescribed to you. Take any new Medicines after you have completely understood and accpet all the possible adverse reactions/side effects.   Please note  You were cared for by a hospitalist during your hospital stay. If you have any questions about your discharge medications or the care you received while you were in the hospital after you are discharged, you can call the unit and asked to speak with the hospitalist on call if the hospitalist that took care of you is not available. Once you are discharged, your primary care physician will handle any further medical issues. Please note that NO REFILLS for any discharge medications will be authorized once you are discharged, as it is imperative that you return to your primary care physician (or establish a relationship with a primary care physician if you do not have one) for your aftercare needs so that they can reassess your need for medications and monitor your lab values.    On the day of Discharge:  VITAL SIGNS:  Blood pressure (!) 150/94,  pulse 91, temperature 98 F (36.7 C), resp. rate 16, height 5\' 4"  (1.626 m), weight 93.4 kg, SpO2 92 %. PHYSICAL EXAMINATION:  GENERAL:  73 y.o.-year-old patient lying in the bed with no acute distress.  EYES: Pupils equal, round, reactive to light and accommodation. No scleral icterus. Extraocular muscles intact.  HEENT: Head atraumatic, normocephalic. Oropharynx and nasopharynx clear.  NECK:  Supple, no  jugular venous distention. No thyroid enlargement, no tenderness.  LUNGS: Normal breath sounds bilaterally, no wheezing, rales,rhonchi or crepitation. No use of accessory muscles of respiration.  CARDIOVASCULAR: S1, S2 normal. No murmurs, rubs, or gallops.  ABDOMEN: Soft, non-tender, non-distended. Bowel sounds present. No organomegaly or mass.  EXTREMITIES: No pedal edema, cyanosis, or clubbing.  NEUROLOGIC: Cranial nerves II through XII are intact. Muscle strength 5/5 in all extremities. Sensation intact. Gait not checked.  PSYCHIATRIC: The patient is alert and oriented x 3.  SKIN: No obvious rash, lesion, or ulcer.  DATA REVIEW:   CBC Recent Labs  Lab 01/09/19 0444  WBC 6.4  HGB 12.7  HCT 38.3  PLT 198    Chemistries  Recent Labs  Lab 01/08/19 0426  NA 139  K 3.7  CL 106  CO2 26  GLUCOSE 116*  BUN 11  CREATININE 0.84  CALCIUM 8.7*     Microbiology Results  Results for orders placed or performed during the hospital encounter of 01/06/19  SARS Coronavirus 2 (CEPHEID - Performed [facility] lab), Hosp Order     Status: None   Collection Time: 01/06/19  9:20 AM  Result Value Ref Range Status   SARS Coronavirus 2 NEGATIVE NEGATIVE Final    Comment: (NOTE) If result is NEGATIVE SARS-CoV-2 target nucleic acids are NOT DETECTED. The SARS-CoV-2 RNA is generally detectable in upper and lower  respiratory specimens during the acute phase of infection. The lowest  concentration of SARS-CoV-2 viral copies this assay can detect is 250  copies / mL. A negative result does not preclude SARS-CoV-2 infection  and should not be used as the sole basis for treatment or other  patient management decisions.  A negative result may occur with  improper specimen collection / handling, submission of specimen other  than nasopharyngeal swab, presence of viral mutation(s) within the  areas targeted by this assay, and inadequate number of viral copies  (<250 copies / mL). A  negative result must be combined with clinical  observations, patient history, and epidemiological information. If result is POSITIVE SARS-CoV-2 target nucleic acids are DETECTED. The SARS-CoV-2 RNA is generally detectable in upper and lower  respiratory specimens dur ing the acute phase of infection.  Positive  results are indicative of active infection with SARS-CoV-2.  Clinical  correlation with patient history and other diagnostic information is  necessary to determine patient infection status.  Positive results do  not rule out bacterial infection or co-infection with other viruses. If result is PRESUMPTIVE POSTIVE SARS-CoV-2 nucleic acids MAY BE PRESENT.   A presumptive positive result was obtained on the submitted specimen  and confirmed on repeat testing.  While 2019 novel coronavirus  (SARS-CoV-2) nucleic acids may be present in the submitted sample  additional confirmatory testing may be necessary for epidemiological  and / or clinical management purposes  to differentiate between  SARS-CoV-2 and other Sarbecovirus currently known to infect humans.  If clinically indicated additional testing with an alternate test  methodology [phone] is advised. The SARS-CoV-2 RNA is generally  detectable in upper and lower respiratory sp ecimens  during the acute  phase of infection. The expected result is Negative. Fact Sheet for Patients:  BoilerBrush.com.cy Fact Sheet for Healthcare Providers: https://pope.com/ This test is not yet approved or cleared by the [place] FDA and has been authorized for detection and/or diagnosis of SARS-CoV-2 by FDA under an Emergency Use Authorization (EUA).  This EUA will remain in effect (meaning this test can be used) for the duration of the COVID-19 declaration under Section 564(b)(1) of the Act, 21 U.S.C. section 360bbb-3(b)(1), unless the authorization is terminated or revoked sooner. Performed  at [facility], [address], [place], [place] [address]     RADIOLOGY:  No results found.   Management plans discussed with the patient, family and they are in agreement.  CODE STATUS: Full Code   TOTAL TIME TAKING CARE OF THIS PATIENT: 32 minutes.    Shaune Pollack M.D on 01/09/2019 at 10:38 AM  Between 7am to 6pm - Pager - [phone]  After 6pm go to [email or web address] - Social research officer, government  [facility] [place] [facility]  Office  [phone]  CC: Primary care physician; Patient, No Pcp Per   Note: This dictation was prepared with Dragon dictation along with smaller phrase technology. Any transcriptional errors that result from this process are unintentional.

## 2019-01-09 NOTE — Progress Notes (Signed)
Called report to [facility]. Answered all questions

## 2019-01-09 NOTE — Care Management Important Message (Signed)
Important Message  Patient Details  Name: Nicole Murillo MRN: [id number] Date of Birth: 1946-07-07   Medicare Important Message Given:  Yes    [name] 01/09/2019, 10:27 AM

## 2019-01-09 NOTE — Progress Notes (Signed)
Change drsg per MD order to [facility]

## 2019-01-09 NOTE — Plan of Care (Signed)
Patient is pleasant and cooperative with care. She is getting up and using the bedside commode. She reports no pain, that her pain is well controlled by her medication.   Problem: Education: Goal: Verbalization of understanding the information provided (i.e., activity precautions, restrictions, etc) will improve Outcome: Progressing Goal: Individualized Educational Video(s) Outcome: Progressing   Problem: Activity: Goal: Ability to ambulate and perform ADLs will improve Outcome: Progressing   Problem: Clinical Measurements: Goal: Postoperative complications will be avoided or minimized Outcome: Progressing   Problem: Self-Concept: Goal: Ability to maintain and perform role responsibilities to the fullest extent possible will improve Outcome: Progressing   Problem: Pain Management: Goal: Pain level will decrease Outcome: Progressing   Problem: Education: Goal: Knowledge of General Education information will improve Description Including pain rating scale, medication(s)/side effects and non-pharmacologic comfort measures Outcome: Progressing   Problem: Health Behavior/Discharge Planning: Goal: Ability to manage health-related needs will improve Outcome: Progressing   Problem: Clinical Measurements: Goal: Ability to maintain clinical measurements within normal limits will improve Outcome: Progressing Goal: Will remain free from infection Outcome: Progressing Goal: Diagnostic test results will improve Outcome: Progressing Goal: Respiratory complications will improve Outcome: Progressing Goal: Cardiovascular complication will be avoided Outcome: Progressing   Problem: Activity: Goal: Risk for activity intolerance will decrease Outcome: Progressing   Problem: Nutrition: Goal: Adequate nutrition will be maintained Outcome: Progressing   Problem: Coping: Goal: Level of anxiety will decrease Outcome: Progressing   Problem: Elimination: Goal: Will not experience  complications related to bowel motility Outcome: Progressing Goal: Will not experience complications related to urinary retention Outcome: Progressing   Problem: Pain Managment: Goal: General experience of comfort will improve Outcome: Progressing   Problem: Safety: Goal: Ability to remain free from injury will improve Outcome: Progressing   Problem: Skin Integrity: Goal: Risk for impaired skin integrity will decrease Outcome: Progressing

## 2019-01-09 NOTE — Progress Notes (Signed)
EMS called for transport.

## 2019-01-09 NOTE — TOC Transition Note (Signed)
Transition of Care [facility]) - CM/SW Discharge Note   Patient Details  Name: Nicole Murillo MRN: [id number] Date of Birth: 02-19-1946  [facility] CM/SW Contact:  Barrie Dunker, RN Phone Number: 01/09/2019, 10:51 AM   Clinical Narrative:    Patient chose a bed at [facility] and stated that her mother went there after a stroke, she is familiar with the facility.  She will notify any family that needs to be made aware, She will be going to room 36B The bedside nurse will call report to [phone], she will transport via EMS Patient is able to complete her own admission paper work at the facility. Discharge packet in the chart   Final next level of care: Skilled Nursing Facility Barriers to Discharge: Barriers Resolved   Patient Goals and CMS Choice Patient states their goals for this hospitalization and ongoing recovery are:: Hope to go home CMS Medicare.gov Compare Post Acute Care list provided to:: (pending disposition after surgical intervention) Choice offered to / list presented to : NA  Discharge Placement                       Discharge Plan and Services   Discharge Planning Services: CM Consult Post Acute Care Choice: NA          DME Arranged: N/A         HH Arranged: NA HH Agency: NA        Social Determinants of Health (SDOH) Interventions     Readmission Risk Interventions No flowsheet data found.

## 2019-01-10 NOTE — Anesthesia Postprocedure Evaluation (Signed)
Anesthesia Post Note  Patient: Nicole Murillo  Procedure(s) Performed: CANNULATED HIP PINNING (Left )  Patient location during evaluation: PACU Anesthesia Type: Spinal Level of consciousness: awake and alert Pain management: pain level controlled Vital Signs Assessment: post-procedure vital signs reviewed and stable Respiratory status: spontaneous breathing, nonlabored ventilation and respiratory function stable Cardiovascular status: blood pressure returned to baseline and stable Postop Assessment: no apparent nausea or vomiting Anesthetic complications: no     Last Vitals:  Vitals:   01/09/19 0745 01/09/19 1329  BP: (!) 150/94 (!) 128/92  Pulse: 91 (!) 111  Resp: 16   Temp: 36.7 C   SpO2: 92% 97%    Last Pain:  Vitals:   01/09/19 0718  TempSrc:   PainSc: 0-No pain                 Jovita Gamma

## 2021-10-05 ENCOUNTER — Emergency Department: Payer: Medicare Other

## 2021-10-05 ENCOUNTER — Emergency Department
Admission: EM | Admit: 2021-10-05 | Discharge: 2021-10-05 | Disposition: A | Discharge status: Home / Self Care | Payer: Medicare Other | Attending: Emergency Medicine | Admitting: Emergency Medicine

## 2021-10-05 ENCOUNTER — Other Ambulatory Visit: Payer: Self-pay

## 2021-10-05 ENCOUNTER — Encounter: Payer: Self-pay | Admitting: Emergency Medicine

## 2021-10-05 DIAGNOSIS — M7989 Other specified soft tissue disorders: Secondary | ICD-10-CM | POA: Insufficient documentation

## 2021-10-05 DIAGNOSIS — S82832A Other fracture of upper and lower end of left fibula, initial encounter for closed fracture: Secondary | ICD-10-CM | POA: Diagnosis not present

## 2021-10-05 DIAGNOSIS — X58XXXA Exposure to other specified factors, initial encounter: Secondary | ICD-10-CM | POA: Insufficient documentation

## 2021-10-05 DIAGNOSIS — S99912A Unspecified injury of left ankle, initial encounter: Secondary | ICD-10-CM | POA: Diagnosis present

## 2021-10-05 NOTE — ED Notes (Signed)
OCL splint applied by provider  Tolerated well

## 2021-10-05 NOTE — Discharge Instructions (Signed)
Please try to not put weight on the left ankle if possible.  You can use the crutches as needed.  You can take Tylenol and Motrin for pain.  Please call [facility] tomorrow to schedule an appointment with the orthopedist.

## 2021-10-05 NOTE — ED Notes (Signed)
Pt states she has "several" walkers at home that she would rather use than crutches Sidney Ace, MD notified.

## 2021-10-05 NOTE — ED Provider Notes (Signed)
[facility] Provider Note    Event Date/Time   First MD Initiated Contact with Patient 10/05/21 1051     (approximate)   History   Ankle Pain and Joint Swelling   HPI  Nicole Murillo is a 76 y.o. female past medical history of obesity prior hip fracture presents with left ankle pain and swelling.  Started about 3 days ago.  She does not recall any preceding injury or twisting the ankle.  She has been able to ambulate on it but with pain.  Denies fevers or chills.  She has been taking aspirin for pain.    Past Medical History:  Diagnosis Date   Environmental allergies     Patient Active Problem List   Diagnosis Date Noted   Closed left hip fracture (HCC) 01/06/2019     Physical Exam  Triage Vital Signs: ED Triage Vitals  Enc Vitals Group     BP 10/05/21 1021 123/87     Pulse Rate 10/05/21 1021 (!) 121     Resp 10/05/21 1021 20     Temp 10/05/21 1021 98.3 F (36.8 C)     Temp Source 10/05/21 1021 Oral     SpO2 10/05/21 1021 96 %     Weight 10/05/21 1025 218 lb 4.1 oz (99 kg)     Height 10/05/21 1025 5\' 5"  (1.651 m)     Head Circumference --      Peak Flow --      Pain Score 10/05/21 1024 10     Pain Loc --      Pain Edu? --      Excl. in GC? --     Most recent vital signs: Vitals:   10/05/21 1021  BP: 123/87  Pulse: (!) 121  Resp: 20  Temp: 98.3 F (36.8 C)  SpO2: 96%     General: Awake, no distress.  CV:  Good peripheral perfusion.  Resp:  Normal effort.  Abd:  No distention.  Neuro:             Awake, Alert, Oriented x 3  Other:  Left ankle with lateral swelling and some mild erythema, tenderness over the lateral malleolus, no medial tenderness, no tenderness of the more proximal tib-fib 2+ DP pulse Strength intact   ED Results / Procedures / Treatments  Labs (all labs ordered are listed, but only abnormal results are displayed) Labs Reviewed - No data to display   EKG     RADIOLOGY The x-ray of the left  ankle which shows a nondisplaced distal fibula fracture   PROCEDURES:    .Ortho Injury Treatment  Date/Time: 10/05/2021 11:59 AM Performed by: 10/07/2021, MD Authorized by: Georga Hacking, MD   Consent:    Consent obtained:  Verbal   Risks discussed:  FractureInjury location: ankle Injury type: fracture Fracture type: lateral malleolus Pre-procedure distal perfusion: normal Pre-procedure neurological function: normal Pre-procedure range of motion: reduced  Anesthesia: Local anesthesia used: no  Patient sedated: NoManipulation performed: no Immobilization: splint Splint type: ankle stirrup Splint Applied by: ED Provider Supplies used: Ortho-Glass Post-procedure neurovascular assessment: post-procedure neurovascularly intact Post-procedure distal perfusion: normal Post-procedure neurological function: normal Post-procedure range of motion: unchanged     MEDICATIONS ORDERED IN ED: Medications - No data to display   IMPRESSION / MDM / ASSESSMENT AND PLAN / ED COURSE  I reviewed the triage vital signs and the nursing notes.  Differential diagnosis includes, but is not limited to, ankle fracture, ankle sprain, gout, DVT  Patient is a 76 year old female presenting with left ankle pain and swelling.  She does not recall any preceding injury or twisting the ankle.  Has been going on for several days she has been able to ambulate.  On exam she has lateral ankle swelling and tenderness over the lateral ankle but not medially.  She is neurovascular intact.  No other swelling or tenderness further up the leg.  X-ray was obtained of the ankle which shows a nondisplaced distal fibula fracture.  No obvious bone pathology seen but question whether this could be a pathologic fracture given no preceding injury.  Also possible patient has osteoporosis and had an ankle injury she just does not recall.  Low suspicion for infection.  Patient was placed  in an ankle stirrup and advised to not weight-bear until she follows up with orthopedics.  She plans to call [facility] tomorrow.  Patient did not require anything for pain.  We discussed Tylenol and Motrin for pain as needed.  She is stable for discharge.      FINAL CLINICAL IMPRESSION(S) / ED DIAGNOSES   Final diagnoses:  Other closed fracture of distal end of left fibula, initial encounter     Rx / DC Orders   ED Discharge Orders     None        Note:  This document was prepared using Dragon voice recognition software and may include unintentional dictation errors.   Georga Hacking, MD 10/05/21 1201

## 2021-10-05 NOTE — ED Triage Notes (Signed)
Pt reports swelling and pain to her left ankle. Pt denies falling or recent injuries. Pt reports did have an injury  to her left hip and leg that put her in rehab a couple of years ago but no issues since then like this. Denies SOB, falls or pain that radiates past her left ankle. Swelling noted.

## 2023-07-07 IMAGING — CR DG ANKLE COMPLETE 3+V*L*
1 series · 3 of 3 positions shown · non-contrast
Comparison: None.

CLINICAL DATA: Leg pain

EXAM:
LEFT ANKLE COMPLETE - 3+ VIEW

[Series 1: dg ankle complete left · 0.14mm/px · 3 of 3 slices shown]
[im 1/3]
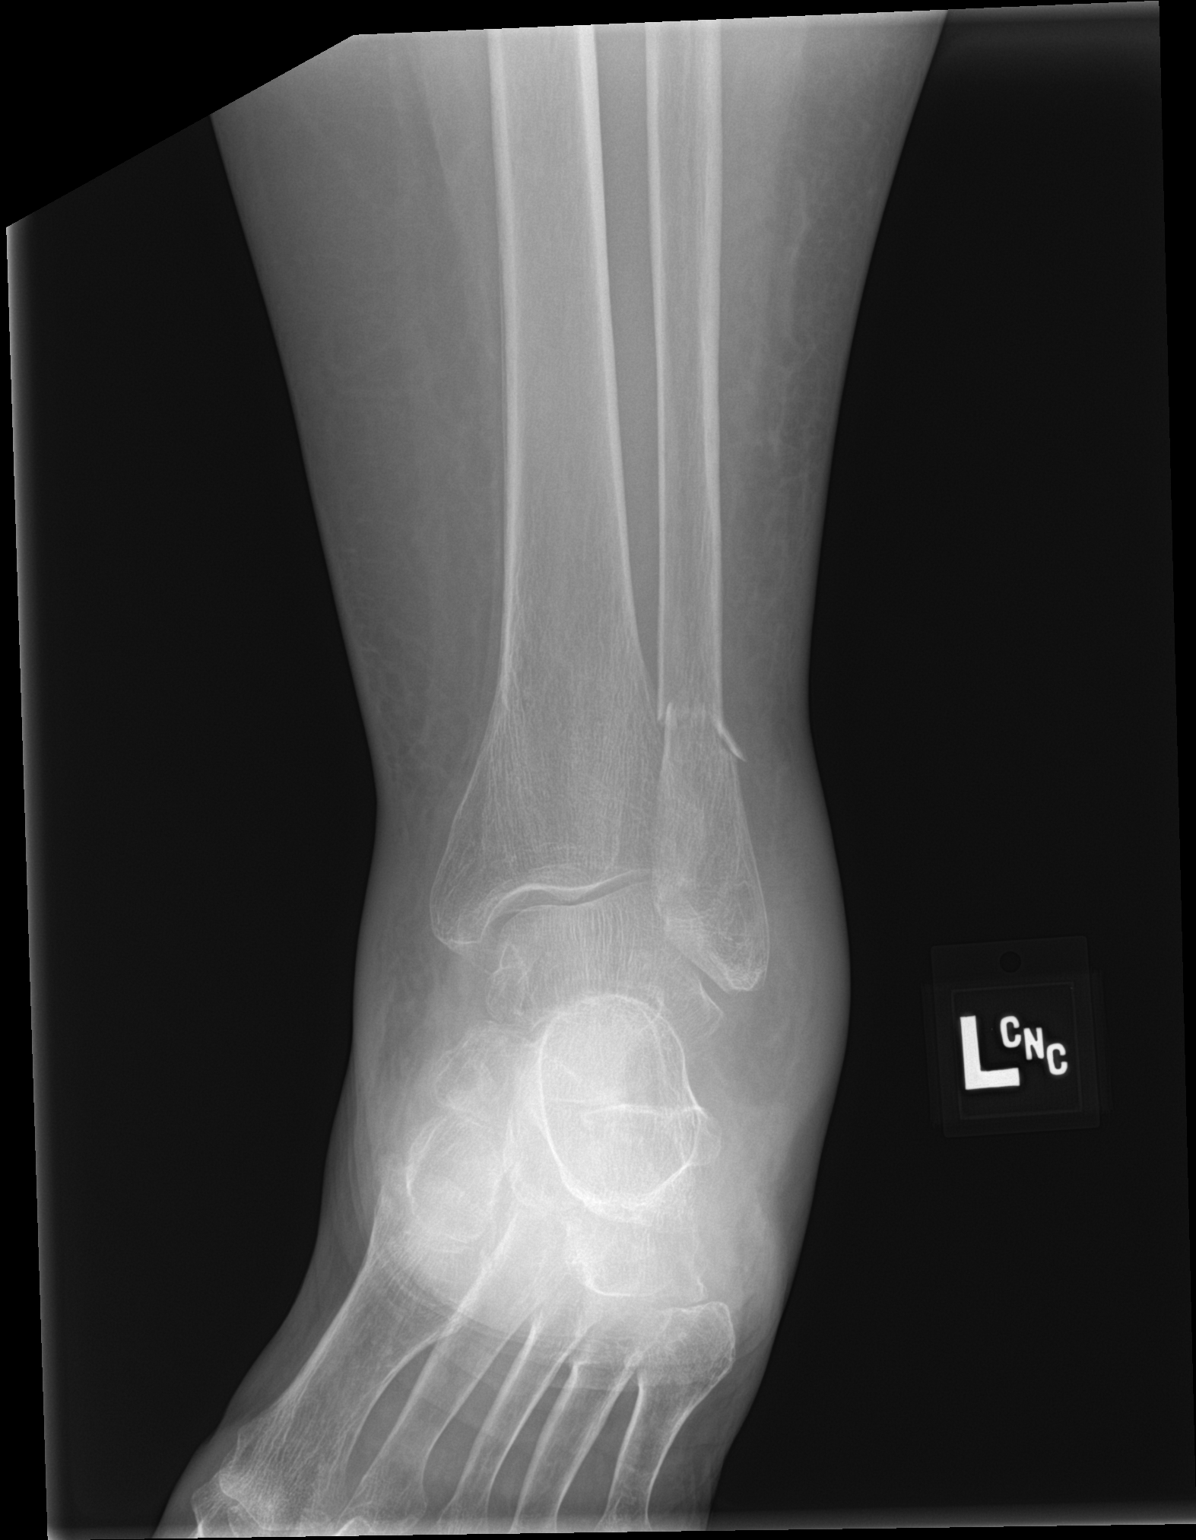
[im 2/3]
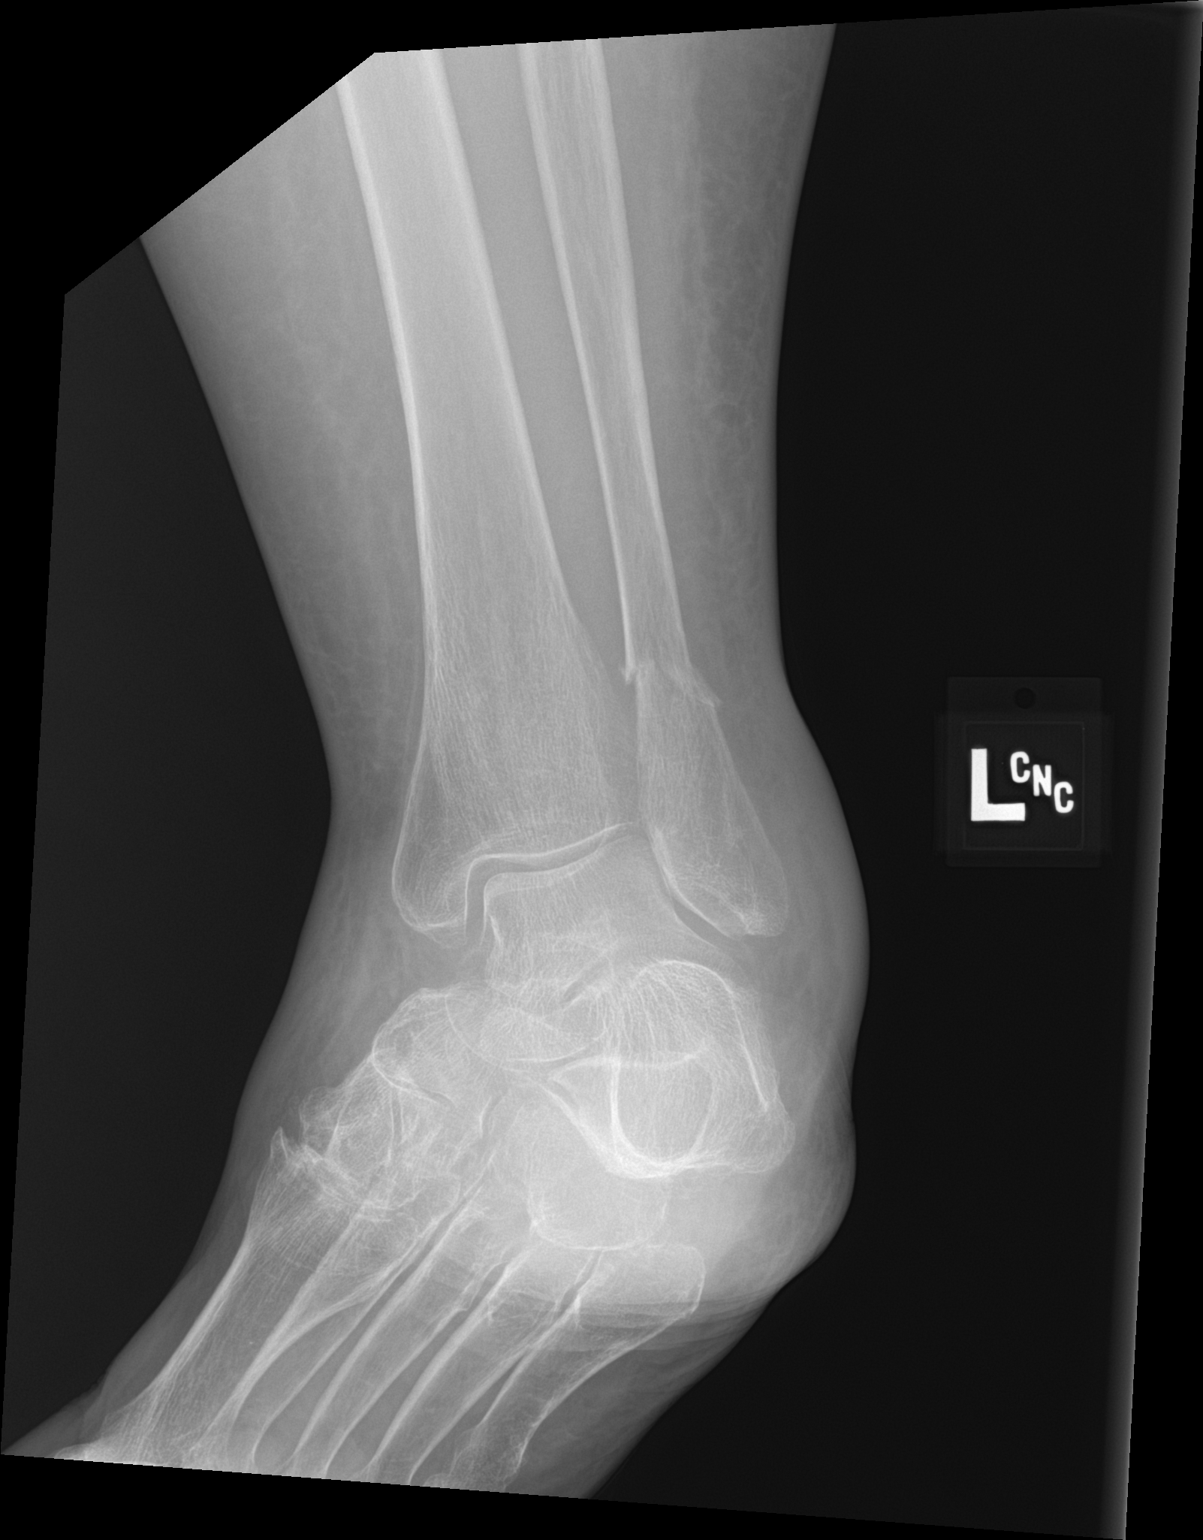
[im 3/3]
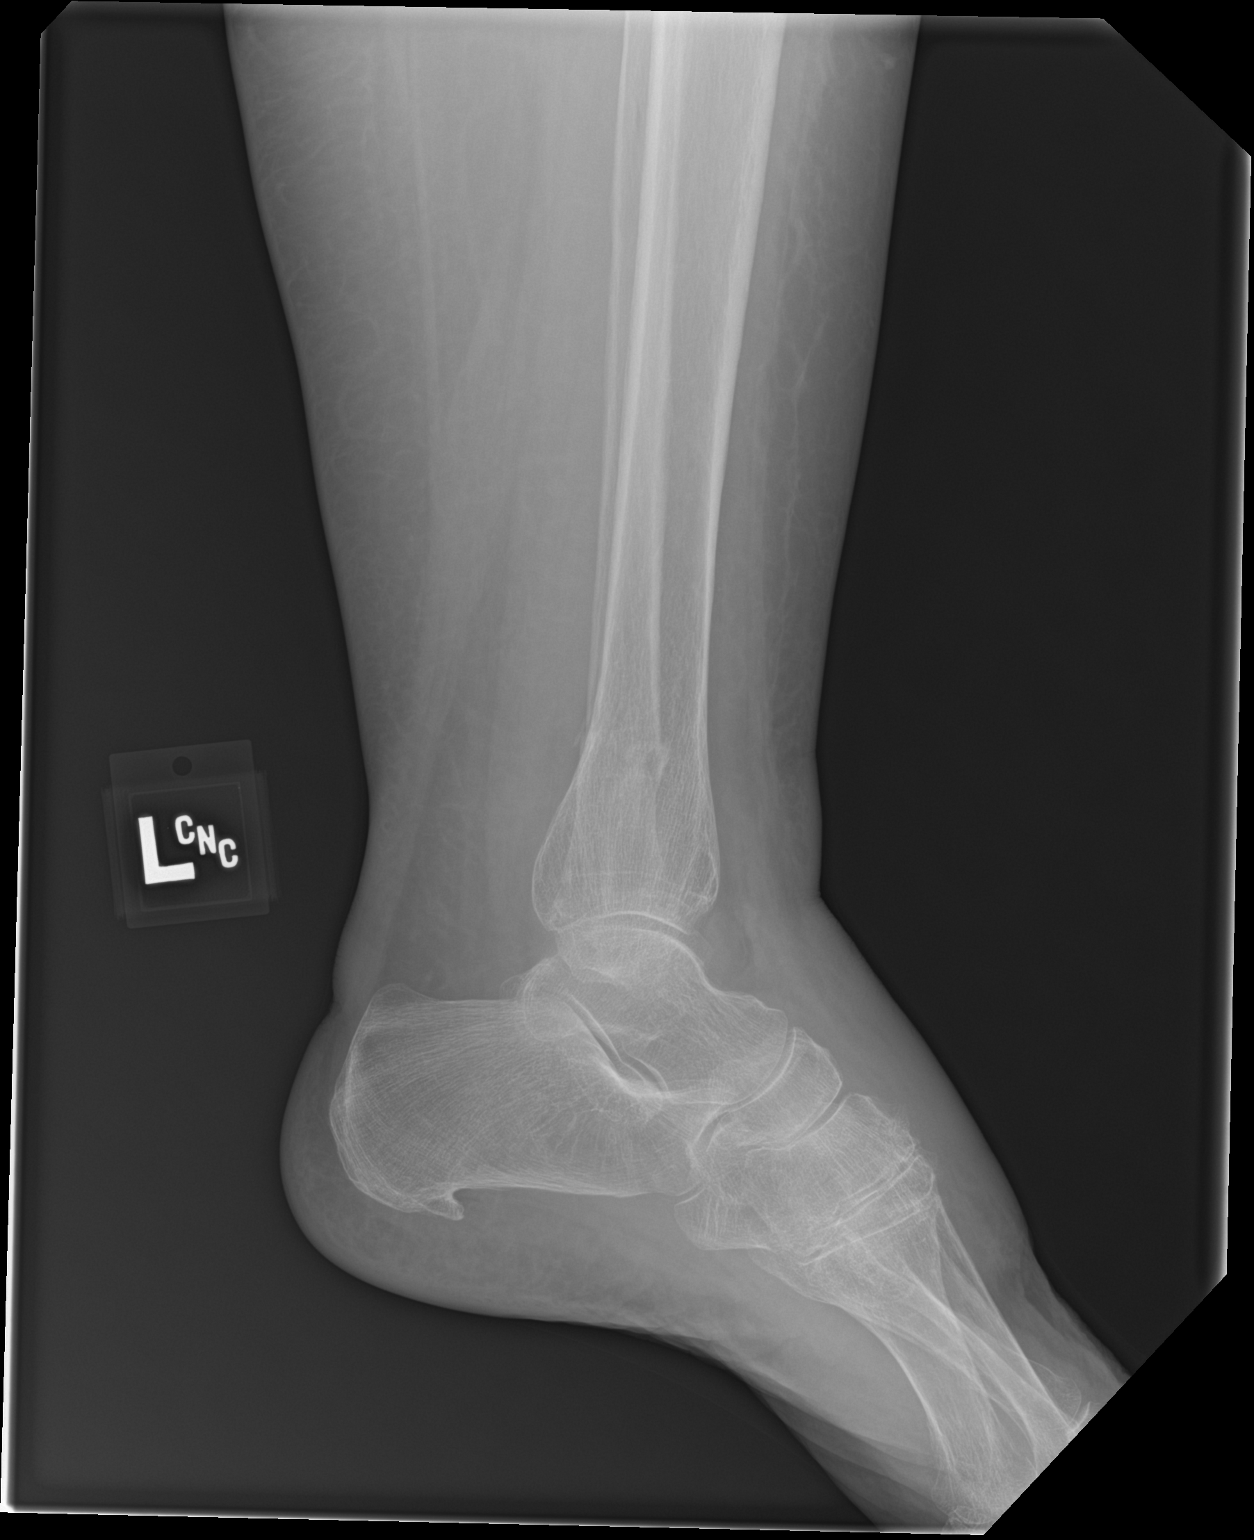

[3 of 3 positions shown; findings below may reference images not displayed]

FINDINGS: Acute transverse fracture at the distal diaphysis of the fibula
without significant displacement. No additional fracture or
dislocation identified. Bones are osteopenic. Large plantar
calcaneal spur noted. Diffuse soft tissue swelling about the ankle
and dorsal foot.
IMPRESSION: Acute nondisplaced fracture of the distal diaphysis of the fibula.
Diffuse soft tissue swelling.

## 2023-09-18 DEATH — deceased
